# Patient Record
Sex: Female | Born: 1985
Health system: Southern US, Community
[De-identification: ages and names within clinical notes are randomized; demographics above are authoritative.]

## PROBLEM LIST (undated history)

## (undated) ENCOUNTER — Inpatient Hospital Stay (HOSPITAL_COMMUNITY): Payer: Self-pay

## (undated) DIAGNOSIS — D219 Benign neoplasm of connective and other soft tissue, unspecified: Secondary | ICD-10-CM

## (undated) DIAGNOSIS — O4400 Placenta previa specified as without hemorrhage, unspecified trimester: Secondary | ICD-10-CM

## (undated) DIAGNOSIS — N979 Female infertility, unspecified: Secondary | ICD-10-CM

## (undated) HISTORY — PX: OTHER SURGICAL HISTORY: SHX169

## (undated) HISTORY — PX: WISDOM TOOTH EXTRACTION: SHX21

## (undated) HISTORY — PX: MYOMECTOMY: SHX85

---

## 2013-05-02 ENCOUNTER — Encounter (HOSPITAL_COMMUNITY): Payer: Self-pay | Admitting: Emergency Medicine

## 2013-05-02 ENCOUNTER — Emergency Department (HOSPITAL_COMMUNITY)
Admission: EM | Admit: 2013-05-02 | Discharge: 2013-05-02 | Disposition: A | Discharge status: Home / Self Care | Attending: Emergency Medicine | Admitting: Emergency Medicine

## 2013-05-02 DIAGNOSIS — W268XXA Contact with other sharp object(s), not elsewhere classified, initial encounter: Secondary | ICD-10-CM | POA: Insufficient documentation

## 2013-05-02 DIAGNOSIS — L0390 Cellulitis, unspecified: Secondary | ICD-10-CM

## 2013-05-02 DIAGNOSIS — S81009A Unspecified open wound, unspecified knee, initial encounter: Secondary | ICD-10-CM | POA: Insufficient documentation

## 2013-05-02 DIAGNOSIS — L02419 Cutaneous abscess of limb, unspecified: Secondary | ICD-10-CM | POA: Insufficient documentation

## 2013-05-02 DIAGNOSIS — Y929 Unspecified place or not applicable: Secondary | ICD-10-CM | POA: Insufficient documentation

## 2013-05-02 DIAGNOSIS — Z23 Encounter for immunization: Secondary | ICD-10-CM | POA: Insufficient documentation

## 2013-05-02 DIAGNOSIS — Y9389 Activity, other specified: Secondary | ICD-10-CM | POA: Insufficient documentation

## 2013-05-02 MED ORDER — CEPHALEXIN 500 MG PO CAPS: 500.0000 mg | ORAL_CAPSULE | Freq: Four times a day (QID) | ORAL | Status: DC

## 2013-05-02 MED ORDER — BACITRACIN ZINC 500 UNIT/GM EX OINT: 1.0000 "application " | TOPICAL_OINTMENT | Freq: Two times a day (BID) | CUTANEOUS | Status: DC

## 2013-05-02 MED ORDER — TETANUS-DIPHTH-ACELL PERTUSSIS 5-2.5-18.5 LF-MCG/0.5 IM SUSP
0.5000 mL | Freq: Once | INTRAMUSCULAR | Status: AC
  Administered 2013-05-02: 0.5 mL via INTRAMUSCULAR
  Filled 2013-05-02: qty 0.5

## 2013-05-02 NOTE — ED Provider Notes (Signed)
CSN: [id number]     Arrival date & time 05/02/13  1528 History  This chart was scribed for non-physician practitioner Sarah Forth, PA-C, working with Sarah Jakes, MD by Sarah Berry, ED Scribe. This patient was seen in room TR05C/TR05C and the patient's care was started at 1528.    Chief Complaint  Patient presents with  . Laceration    The history is provided by the patient. No language interpreter was used.    HPI Comments: Sarah Berry is a 27 y.o. female who presents to the Emergency Department complaining of a laceration to the lateral aspect of the left ankle that occurred 4 days ago. Pt states she was shaving when the laceration occurred. She complains of constant, unchanged pain to the left posterior ankle near her achilles tendon that began today. She denies having any pain prior to this morning. She is concerned that the wound may be inffected. Her last tetanus was about 5 years ago. She denies any other symptoms including nausea, vomiting, fever, chills.  History reviewed. No pertinent past medical history. History reviewed. No pertinent past surgical history. No family history on file. History  Substance Use Topics  . Smoking status: Never Smoker   . Smokeless tobacco: Not on file  . Alcohol Use: No   OB History   Grav Para Term Preterm Abortions TAB SAB Ect Mult Living                 Review of Systems  Constitutional: Negative for fever and chills.  Gastrointestinal: Negative for nausea and vomiting.  Musculoskeletal: Positive for arthralgias (left ankle pain) and joint swelling. Negative for back pain, neck pain and neck stiffness.  Skin: Positive for wound.  Neurological: Negative for numbness.  Hematological: Does not bruise/bleed easily.  Psychiatric/Behavioral: The patient is not nervous/anxious.   All other systems reviewed and are negative.    Allergies  Bactrim  Home Medications   Current Outpatient Rx  Name  Route  Sig  Dispense   Refill  . bacitracin ointment   Topical   Apply 1 application topically 2 (two) times daily.   15 g   0   . cephALEXin (KEFLEX) 500 MG capsule   Oral   Take 1 capsule (500 mg total) by mouth 4 (four) times daily.   40 capsule   0    BP 137/94  Pulse 96  Temp(Src) 97.9 F (36.6 C) (Oral)  Resp 20  SpO2 100%  LMP 04/28/2013 Physical Exam  Nursing note and vitals reviewed. Constitutional: She is oriented to person, place, and time. She appears well-developed and well-nourished. No distress.  HENT:  Head: Normocephalic and atraumatic.  Eyes: Conjunctivae are normal. No scleral icterus.  Neck: Normal range of motion.  Cardiovascular: Normal rate, regular rhythm, normal heart sounds and intact distal pulses.   No murmur heard. Capillary refill < 3 sec  Pulmonary/Chest: Effort normal and breath sounds normal. No respiratory distress.  Musculoskeletal: Normal range of motion. She exhibits tenderness. She exhibits no edema.  ROM: Full range of motion of the left ankle with mild pain No pain to palpation of the Achilles tendon Negative Thompson's test  Neurological: She is alert and oriented to person, place, and time. Coordination normal.  Sensation: Intact and on chart throughout the entirety of the left extremity Strength: 5 out of 5 in the left lower extremity including dorsiflexion and plantar flexion  Skin: Skin is warm and dry. No rash noted. She is not diaphoretic. There is  erythema.  No tenting of the skin 6 cm laceration to the anterior left lower leg with some purulent drainage and mild erythema  Psychiatric: She has a normal mood and affect.    ED Course  Procedures   DIAGNOSTIC STUDIES: Oxygen Saturation is 100% on RA, normal by my interpretation.    COORDINATION OF CARE: 4:43 PM Discussed treatment plan with pt at bedside and pt agreed to plan.   Labs Review Labs Reviewed - No data to display Imaging Review No results found.  EKG Interpretation   None       EMERGENCY DEPARTMENT US SOFT TISSUE INTERPRETATION "Study: Limited Ultrasound of the noted body part in comments below"  INDICATIONS: Pain and Soft tissue infection Multiple views of the body part are obtained with a multi-frequency linear probe  PERFORMED BY:  Myself  IMAGES ARCHIVED?: Yes  SIDE:Left  BODY PART:Lower extremity  FINDINGS: No abcess noted  LIMITATIONS:  none  INTERPRETATION:  No abcess noted  COMMENT:  No fluid collection noted at the site a laceration or around the Achilles   MDM   1. Cellulitis     Sarah Berry presents with hx and PE consistent with cellulitis.  Pt is without risk factors for HIV; no recent use of steroids or other immunosuppressive medications; no Hx of diabetes.  Area evaluated with ultrasound including the internal his tendon and it is without gross abscess for which I&D would be possible.  Area marked and pt encouraged to return if redness begins to streak, extends beyond the markings, and/or fever or nausea/vomiting develop.  Pt is alert, oriented, NAD, afebrile, non tachycardic, nonseptic and nontoxic appearing.  Pt to be d/c on oral antibiotics with strict f/u instructions.    It has been determined that no acute conditions requiring further emergency intervention are present at this time. The patient/guardian have been advised of the diagnosis and plan. We have discussed signs and symptoms that warrant return to the ED, such as changes or worsening in symptoms.   Vital signs are stable at discharge.   BP 137/94  Pulse 96  Temp(Src) 97.9 F (36.6 C) (Oral)  Resp 20  SpO2 100%  LMP 04/28/2013  Patient/guardian has voiced understanding and agreed to follow-up with the PCP or specialist.    I personally performed the services described in this documentation, which was scribed in my presence. The recorded information has been reviewed and is accurate.    Sarah Berry Sarah Lean, PA-C 05/02/13 1711

## 2013-05-02 NOTE — ED Notes (Signed)
Pt st's she cut her right lower leg while shaving 4 days ago.   Pt st's now area is red and painful.

## 2013-05-07 NOTE — ED Provider Notes (Signed)
Medical screening examination/treatment/procedure(s) were performed by non-physician practitioner and as supervising physician I was immediately available for consultation/collaboration.  EKG Interpretation   None         Lindora Alviar W. Ariza Evans, MD 05/07/13 0834 

## 2015-12-02 LAB — OB RESULTS CONSOLE GC/CHLAMYDIA
Chlamydia: NEGATIVE
Gonorrhea: NEGATIVE

## 2015-12-02 LAB — OB RESULTS CONSOLE HIV ANTIBODY (ROUTINE TESTING): HIV: NONREACTIVE

## 2015-12-02 LAB — OB RESULTS CONSOLE RUBELLA ANTIBODY, IGM: Rubella: IMMUNE

## 2015-12-02 LAB — OB RESULTS CONSOLE ABO/RH: RH TYPE: POSITIVE

## 2015-12-02 LAB — OB RESULTS CONSOLE HEPATITIS B SURFACE ANTIGEN: Hepatitis B Surface Ag: NEGATIVE

## 2015-12-02 LAB — OB RESULTS CONSOLE RPR: RPR: NONREACTIVE

## 2015-12-02 LAB — OB RESULTS CONSOLE ANTIBODY SCREEN: Antibody Screen: NEGATIVE

## 2015-12-06 ENCOUNTER — Encounter (HOSPITAL_COMMUNITY): Payer: Self-pay | Admitting: *Deleted

## 2015-12-06 ENCOUNTER — Inpatient Hospital Stay (HOSPITAL_COMMUNITY)
Admission: AD | Admit: 2015-12-06 | Discharge: 2015-12-06 | Disposition: A | Discharge status: Home / Self Care | Payer: BLUE CROSS/BLUE SHIELD | Source: Ambulatory Visit | Attending: Obstetrics & Gynecology | Admitting: Obstetrics & Gynecology

## 2015-12-06 ENCOUNTER — Emergency Department (HOSPITAL_COMMUNITY)
Admission: EM | Admit: 2015-12-06 | Discharge: 2015-12-06 | Discharge status: Against Medical Advice | Payer: BLUE CROSS/BLUE SHIELD | Source: Home / Self Care

## 2015-12-06 DIAGNOSIS — Z5321 Procedure and treatment not carried out due to patient leaving prior to being seen by health care provider: Secondary | ICD-10-CM | POA: Insufficient documentation

## 2015-12-06 DIAGNOSIS — O209 Hemorrhage in early pregnancy, unspecified: Secondary | ICD-10-CM | POA: Insufficient documentation

## 2015-12-06 DIAGNOSIS — Z3A12 12 weeks gestation of pregnancy: Secondary | ICD-10-CM | POA: Diagnosis not present

## 2015-12-06 DIAGNOSIS — Z3A13 13 weeks gestation of pregnancy: Secondary | ICD-10-CM | POA: Insufficient documentation

## 2015-12-06 HISTORY — DX: Female infertility, unspecified: N97.9

## 2015-12-06 HISTORY — DX: Benign neoplasm of connective and other soft tissue, unspecified: D21.9

## 2015-12-06 NOTE — MAU Note (Signed)
Pt brought into MAU by Dr. Alwyn Pea with vaginal bleeding. Approx [redacted] week pregnant.

## 2015-12-06 NOTE — ED Notes (Signed)
I have just been informed by our registrationist, Pam that  A female doctor came into our lobby, met pt. And they collectively informed our registrationist that they are going immediately to [place] then asked Pam to have me "take the patient out of the computer".

## 2015-12-06 NOTE — ED Triage Notes (Addendum)
Pt is [redacted] weeks pregnant. Pt complains of vaginal bleeding since 420PM today, lower abdominal cramping since this morning. Pt states she had ultrasound showing subchorionic hematoma 2 days ago. Pt states she had subchorionic hematoma 5-6 weeks ago and had bleeding as well, but did not have cramping as she does today. Pt is unsure how much blood was lost, pt was not wearing pad at the time.  Pt is currently taking antibiotics for bacterial vaginosis since Tuesday.

## 2015-12-06 NOTE — Discharge Instructions (Signed)
Keep your scheduled appointment for prenatal care. Call the office or provider on call with further concerns and/or return to MAU as needed. Vaginal Bleeding During Pregnancy, Second Trimester A small amount of bleeding (spotting) from the vagina is relatively common in pregnancy. It usually stops on its own. Various things can cause bleeding or spotting in pregnancy. Some bleeding may be related to the pregnancy, and some may not. Sometimes the bleeding is normal and is not a problem. However, bleeding can also be a sign of something serious. Be sure to tell your health care provider about any vaginal bleeding right away. Some possible causes of vaginal bleeding during the second trimester include:  Infection, inflammation, or growths on the cervix.   The placenta may be partially or completely covering the opening of the cervix inside the uterus (placenta previa).  The placenta may have separated from the uterus (abruption of the placenta).   You may be having early (preterm) labor.   The cervix may not be strong enough to keep a baby inside the uterus (cervical insufficiency).   Tiny cysts may have developed in the uterus instead of pregnancy tissue (molar pregnancy). HOME CARE INSTRUCTIONS  Watch your condition for any changes. The following actions may help to lessen any discomfort you are feeling:  Follow your health care provider's instructions for limiting your activity. If your health care provider orders bed rest, you may need to stay in bed and only get up to use the bathroom. However, your health care provider may allow you to continue light activity.  If needed, make plans for someone to help with your regular activities and responsibilities while you are on bed rest.  Keep track of the number of pads you use each day, how often you change pads, and how soaked (saturated) they are. Write this down.  Do not use tampons. Do not douche.  Do not have sexual intercourse or  orgasms until approved by your health care provider.  If you pass any tissue from your vagina, save the tissue so you can show it to your health care provider.  Only take over-the-counter or prescription medicines as directed by your health care provider.  Do not take aspirin because it can make you bleed.  Do not exercise or perform any strenuous activities or heavy lifting without your health care provider's permission.  Keep all follow-up appointments as directed by your health care provider. SEEK MEDICAL CARE IF:  You have any vaginal bleeding during any part of your pregnancy.  You have cramps or labor pains.  You have a fever, not controlled by medicine. SEEK IMMEDIATE MEDICAL CARE IF:   You have severe cramps in your back or belly (abdomen).  You have contractions.  You have chills.  You pass large clots or tissue from your vagina.  Your bleeding increases.  You feel light-headed or weak, or you have fainting episodes.  You are leaking fluid or have a gush of fluid from your vagina. MAKE SURE YOU:  Understand these instructions.  Will watch your condition.  Will get help right away if you are not doing well or get worse.   This information is not intended to replace advice given to you by your health care provider. Make sure you discuss any questions you have with your health care provider.   Document Released: 02/03/2005 Document Revised: 05/01/2013 Document Reviewed: 01/01/2013 Elsevier Interactive Patient Education [employer].

## 2015-12-06 NOTE — MAU Provider Note (Signed)
  History   30 yo G1P0 at 12 weeks 4 days presents with acute hemorrhage at work / home, she notes  It soaked through her underwear and onto the floor. She also passed clots.  She denies chest pain,  Shortness of breath, no light headedness or dizziness.  She is having abdominal/pelvic cramping.    CSN: [id number]  Arrival date and time: 12/06/15 1757   None     Chief Complaint  Patient presents with  . Vaginal Bleeding   HPI  Pertinent Gynecological History: Menses: regular every month without intermenstrual spotting Bleeding: acute bleed in pregnancy Contraception: none DES exposure: denies Blood transfusions: none Sexually transmitted diseases: no past history Previous GYN Procedures: none  Last mammogram: n/a  Last pap: normal Date: 11/2015   Past Medical History:  Diagnosis Date  . Fibroid   . Infertility, female, primary   . Newborn product of in vitro fertilization (IVF) pregnancy     Past Surgical History:  Procedure Laterality Date  . egg retrieval    . MYOMECTOMY      History reviewed. No pertinent family history.  Social History  Substance Use Topics  . Smoking status: Never Smoker  . Smokeless tobacco: Never Used  . Alcohol use No    Allergies:  Allergies  Allergen Reactions  . Bactrim [Sulfamethoxazole-Trimethoprim] Other (See Comments)    "childhood allergy"    Prescriptions Prior to Admission  Medication Sig Dispense Refill Last Dose  . bacitracin ointment Apply 1 application topically 2 (two) times daily. 15 g 0   . cephALEXin (KEFLEX) 500 MG capsule Take 1 capsule (500 mg total) by mouth 4 (four) times daily. 40 capsule 0     ROS Physical Exam   Blood pressure 129/80, pulse 92, temperature 97.9 F (36.6 C), temperature source Oral, resp. rate 18.  Physical Exam External vulva: normal Vaginal vault: old blood Cervical os: closed, long    MAU Course  Procedures Bedside TV sonogram:  Viable SIUP, possible subchorionic hematoma  @2  cm., Possible  Placenta previa.  Cervix: long    Assessment and Plan  30 yo G1P0 IVF pregnancy at 12 weeks 4 days with acute bleed, possible [facility] vs previa Patient given AB precautions Will f/u in office next week for visit with me and official ultrasound.  Pelvic rest Note provided for off days from work  Sanjuana Kava [facility] 12/06/2015, 6:23 PM

## 2015-12-31 ENCOUNTER — Other Ambulatory Visit: Payer: Self-pay | Admitting: Obstetrics & Gynecology

## 2016-02-03 ENCOUNTER — Inpatient Hospital Stay (HOSPITAL_COMMUNITY): Payer: BLUE CROSS/BLUE SHIELD

## 2016-02-03 ENCOUNTER — Encounter (HOSPITAL_COMMUNITY): Payer: Self-pay

## 2016-02-03 ENCOUNTER — Inpatient Hospital Stay (HOSPITAL_COMMUNITY)
Admission: AD | Admit: 2016-02-03 | Discharge: 2016-02-03 | Disposition: A | Discharge status: Home / Self Care | Payer: BLUE CROSS/BLUE SHIELD | Source: Ambulatory Visit | Attending: Obstetrics and Gynecology | Admitting: Obstetrics and Gynecology

## 2016-02-03 DIAGNOSIS — Z3A21 21 weeks gestation of pregnancy: Secondary | ICD-10-CM | POA: Insufficient documentation

## 2016-02-03 DIAGNOSIS — O4692 Antepartum hemorrhage, unspecified, second trimester: Secondary | ICD-10-CM | POA: Diagnosis not present

## 2016-02-03 DIAGNOSIS — O4402 Placenta previa specified as without hemorrhage, second trimester: Secondary | ICD-10-CM | POA: Diagnosis not present

## 2016-02-03 LAB — CBC
HEMATOCRIT: 30.3 % — AB (ref 36.0–46.0)
HEMOGLOBIN: 10.9 g/dL — AB (ref 12.0–15.0)
MCH: 30.6 pg (ref 26.0–34.0)
MCHC: 36 g/dL (ref 30.0–36.0)
MCV: 85.1 fL (ref 78.0–100.0)
Platelets: 230 10*3/uL (ref 150–400)
RBC: 3.56 MIL/uL — ABNORMAL LOW (ref 3.87–5.11)
RDW: 12.4 % (ref 11.5–15.5)
WBC: 7.9 10*3/uL (ref 4.0–10.5)

## 2016-02-03 LAB — ABO/RH: ABO/RH(D): O POS

## 2016-02-03 NOTE — MAU Provider Note (Signed)
History     CSN: [id number]  Arrival date and time: 02/03/16 [id number]   First Provider Initiated Contact with Patient 02/03/16 [phone]      Chief Complaint  Patient presents with  . Vaginal Bleeding   HPI Sarah Berry is a 30 y.o. G1P0 at [redacted]w[redacted]d who presents with vaginal bleeding. OBhx significant for complete placenta previa & [facility]. Pt states this is her first bleed since finding out about the previa; last episode of vaginal bleeding was at 12 weeks. Woke up this morning with episode of bright red blood that soaked through her clothes & blankets; left a area on bed of blood ~10 cm diameter. Pt went to the shower to clean up & passed a golf ball sized clot. States the bleeding has decreased since then. Denies abdominal pain. Pt on pelvic rest d/t the previa & denies intercourse.   OB History    Gravida Para Term Preterm AB Living   1         0   SAB TAB Ectopic Multiple Live Births                  Past Medical History:  Diagnosis Date  . Fibroid   . Infertility, female, primary   . Newborn product of in vitro fertilization (IVF) pregnancy     Past Surgical History:  Procedure Laterality Date  . egg retrieval    . MYOMECTOMY      Family History  Problem Relation Age of Onset  . Cancer Paternal Grandmother     Social History  Substance Use Topics  . Smoking status: Never Smoker  . Smokeless tobacco: Never Used  . Alcohol use No    Allergies:  Allergies  Allergen Reactions  . Bactrim [Sulfamethoxazole-Trimethoprim] Other (See Comments)    "childhood allergy"    Prescriptions Prior to Admission  Medication Sig Dispense Refill Last Dose  . diphenhydrAMINE (BENADRYL) 25 MG tablet Take 25 mg by mouth every 6 (six) hours as needed.   02/02/2016 at Unknown time  . Prenat-FeFmCb-DSS-FA-DHA w/o A (CITRANATAL HARMONY) 27-1-260 MG CAPS Take 1 capsule by mouth daily.   11 02/02/2016 at Unknown time    Review of Systems  Constitutional: Negative.   Gastrointestinal: Negative.    Genitourinary:       + vaginal bleeding   Physical Exam   Blood pressure 126/83, pulse 96, temperature 98.2 F (36.8 C), temperature source Oral, resp. rate 18, height 5' 7.5" (1.715 m), weight 171 lb (77.6 kg), SpO2 96 %.  Physical Exam  Nursing note and vitals reviewed. Constitutional: She is oriented to person, place, and time. She appears well-developed and well-nourished. No distress.  HENT:  Head: Normocephalic and atraumatic.  Eyes: Conjunctivae are normal. Right eye exhibits no discharge. Left eye exhibits no discharge. No scleral icterus.  Neck: Normal range of motion.  Respiratory: Effort normal. No respiratory distress.  Genitourinary:  Genitourinary Comments: No blood on pad since arriving to MAU. Small amount of red staining of vulva & upper inner thigh  Neurological: She is alert and oriented to person, place, and time.  Skin: Skin is warm and dry. She is not diaphoretic.  Psychiatric: She has a normal mood and affect. Her behavior is normal. Judgment and thought content normal.    MAU Course  Procedures Results for orders placed or performed during the hospital encounter of 02/03/16 (from the past 24 hour(s))  CBC     Status: Abnormal   Collection Time: 02/03/16  9:55 AM  Result Value Ref Range   WBC 7.9 4.0 - 10.5 K/uL   RBC 3.56 (L) 3.87 - 5.11 MIL/uL   Hemoglobin 10.9 (L) 12.0 - 15.0 g/dL   HCT 30.3 (L) 36.0 - 46.0 %   MCV 85.1 78.0 - 100.0 fL   MCH 30.6 26.0 - 34.0 pg   MCHC 36.0 30.0 - 36.0 g/dL   RDW 12.4 11.5 - 15.5 %   Platelets 230 150 - 400 K/uL  ABO/Rh     Status: None   Collection Time: 02/03/16  9:55 AM  Result Value Ref Range   ABO/RH(D) O POS     MDM CBC, abo/rh, ultrasound FHT 145 by doppler Ultrasound shows complete placenta previa & 2 myomas. No [place] or abruption S/w Dr. Harrington Challenger. Can discharge home on modified bedrest x 72 hrs to return if symptoms worsen. F/u in office by Thursday or Friday Discussed results & plan with patient; pt  agreeable to plan Assessment and Plan  A: 1. [redacted] weeks gestation of pregnancy   2. Vaginal bleeding in pregnancy, second trimester   3. Placenta previa antepartum in second trimester     P: Discharge home Pelvic rest & modified bedrest -- work note provided Discussed reasons to return to MAU Call office to schedule f/u appointment by the end of the week  Jorje Guild 02/03/2016, 9:39 AM

## 2016-02-03 NOTE — MAU Note (Signed)
Pt c/o a large amount of bleeding when she woke up this morning. Pt states she has previa and has previously had bleeding with this pregnancy. Pt states she also passed a clot. Pt states baby is moving normally. Pt denies leaking of fluid.

## 2016-02-03 NOTE — Discharge Instructions (Signed)
Pelvic Rest Pelvic rest is sometimes recommended for women when:   The placenta is partially or completely covering the opening of the cervix (placenta previa).  There is bleeding between the uterine wall and the amniotic sac in the first trimester (subchorionic hemorrhage).  The cervix begins to open without labor starting (incompetent cervix, cervical insufficiency).  The labor is too early (preterm labor). HOME CARE INSTRUCTIONS  Do not have sexual intercourse, stimulation, or an orgasm.  Do not use tampons, douche, or put anything in the vagina.  Do not lift anything over 10 pounds (4.5 kg).  Avoid strenuous activity or straining your pelvic muscles. SEEK MEDICAL CARE IF:  You have any vaginal bleeding during pregnancy. Treat this as a potential emergency.  You have cramping pain felt low in the stomach (stronger than menstrual cramps).  You notice vaginal discharge (watery, mucus, or bloody).  You have a low, dull backache.  There are regular contractions or uterine tightening. SEEK IMMEDIATE MEDICAL CARE IF: You have vaginal bleeding and have placenta previa.    This information is not intended to replace advice given to you by your health care provider. Make sure you discuss any questions you have with your health care provider.   Document Released: 08/21/2010 Document Revised: 07/19/2011 Document Reviewed: 10/28/2014 Elsevier Interactive Patient Education 2016 [employer]. Placenta Previa Placenta previa is a condition in pregnant women where the placenta implants in the lower part of the uterus. The placenta either partially or completely covers the opening to the cervix. This is a problem because the baby must pass through the cervix during delivery. There are three types of placenta previa. They include:  1. Marginal placenta previa. The placenta is near the cervix, but does not cover the opening. 2. Partial placenta previa. The placenta covers part of the  cervical opening. 3. Complete placenta previa. The placenta covers the entire cervical opening.  Depending on the type of placenta previa, there is a chance the placenta may move into a normal position and no longer cover the cervix as the pregnancy progresses. It is important to keep all prenatal visits with your caregiver.  RISK FACTORS You may be more likely to develop placenta previa if you:   Are carrying more than one baby (multiples).   Have an abnormally shaped uterus.   Have scars on the lining of the uterus.   Had previous surgeries involving the uterus, such as a cesarean delivery.   Have delivered a baby previously.   Have a history of placenta previa.   Have smoked or used cocaine during pregnancy.   Are age 32 or older during pregnancy.  SYMPTOMS The main symptom of placenta previa is sudden, painless vaginal bleeding during the second half of pregnancy. The amount of bleeding can be light to very heavy. The bleeding may stop on its own, but almost always returns. Cramping, regular contractions, abdominal pain, and lower back pain can also occur with placenta previa.  DIAGNOSIS Placenta previa can be diagnosed through an ultrasound by finding where the placenta is located. The ultrasound may find placenta previa either during a routine prenatal visit or after vaginal bleeding is noticed. If you are diagnosed with placenta previa, your caregiver may avoid vaginal exams to reduce the risk of heavy bleeding. There is a chance that placenta previa may not be diagnosed until bleeding occurs during labor.  TREATMENT Specific treatment depends on:   How much you are bleeding or if the bleeding has stopped.  How far  along you are in your pregnancy.   The condition of the baby.   The location of the baby and placenta.   The type of placenta previa.  Depending on the factors above, your caregiver may recommend:   Decreased activity.   Bed rest at home or in  the hospital.  Pelvic rest. This means no sex, using tampons, douching, pelvic exams, or placing anything into the vagina.  A blood transfusion to replace maternal blood loss.  A cesarean delivery if the bleeding is heavy and cannot be controlled or the placenta completely covers the cervix.  Medication to stop premature labor or mature the fetal lungs if delivery is needed before the pregnancy is full term.  WHEN SHOULD YOU SEEK IMMEDIATE MEDICAL CARE IF YOU ARE SENT HOME WITH PLACENTA PREVIA? Seek immediate medical care if you show any symptoms of placenta previa. You will need to go to the hospital to get checked immediately. Again, those symptoms are:  Sudden, painless vaginal bleeding, even a small amount.  Cramping or regular contractions.  Lower back or abdominal pain.   This information is not intended to replace advice given to you by your health care provider. Make sure you discuss any questions you have with your health care provider.   Document Released: 04/26/2005 Document Revised: 05/17/2014 Document Reviewed: 07/28/2012 Elsevier Interactive Patient Education [employer].

## 2016-02-06 ENCOUNTER — Encounter (HOSPITAL_COMMUNITY): Payer: Self-pay | Admitting: Obstetrics

## 2016-02-06 ENCOUNTER — Other Ambulatory Visit (HOSPITAL_COMMUNITY): Payer: Self-pay | Admitting: Obstetrics

## 2016-02-06 DIAGNOSIS — Z3A22 22 weeks gestation of pregnancy: Secondary | ICD-10-CM

## 2016-02-06 DIAGNOSIS — Z3689 Encounter for other specified antenatal screening: Secondary | ICD-10-CM

## 2016-02-13 ENCOUNTER — Encounter (HOSPITAL_COMMUNITY): Payer: Self-pay

## 2016-02-13 ENCOUNTER — Other Ambulatory Visit (HOSPITAL_COMMUNITY): Payer: Self-pay | Admitting: Obstetrics

## 2016-02-13 ENCOUNTER — Ambulatory Visit (HOSPITAL_COMMUNITY)
Admission: RE | Admit: 2016-02-13 | Discharge: 2016-02-13 | Disposition: A | Discharge status: Home / Self Care | Payer: BLUE CROSS/BLUE SHIELD | Source: Ambulatory Visit | Attending: Obstetrics | Admitting: Obstetrics

## 2016-02-13 DIAGNOSIS — Z363 Encounter for antenatal screening for malformations: Secondary | ICD-10-CM | POA: Insufficient documentation

## 2016-02-13 DIAGNOSIS — Z3A22 22 weeks gestation of pregnancy: Secondary | ICD-10-CM

## 2016-02-13 DIAGNOSIS — Z3689 Encounter for other specified antenatal screening: Secondary | ICD-10-CM

## 2016-02-13 DIAGNOSIS — O09812 Supervision of pregnancy resulting from assisted reproductive technology, second trimester: Secondary | ICD-10-CM

## 2016-02-13 DIAGNOSIS — D259 Leiomyoma of uterus, unspecified: Secondary | ICD-10-CM | POA: Diagnosis not present

## 2016-02-13 DIAGNOSIS — O4402 Placenta previa specified as without hemorrhage, second trimester: Secondary | ICD-10-CM

## 2016-02-13 DIAGNOSIS — O3412 Maternal care for benign tumor of corpus uteri, second trimester: Secondary | ICD-10-CM | POA: Diagnosis not present

## 2016-02-13 NOTE — Consult Note (Signed)
Maternal Fetal Medicine Consultation  Requesting Provider(s): Jerelyn Charles, MD  Reason for consultation: Placenta previa, possible vasa previa s/p admission for vaginal bleeding  HPI: Sarah Berry is a 30 yo G1P0, EDD 06/15/2016 who is currently at 22w 3d seen for consultation due to placenta previa / possible vasa previa who has now been seen in MAU on several occasions due to vaginal bleeding.  Most recent episode of vaginal bleeding occurred on 9/26 - the patient reports that she woke up in bed and had an episode of bright red bleeding that soaked through her closes and blankets - passed a golf ball sized clot.  She had some brownish vaginal discharge subsequently, but no active vaginal bleeding.  Earlier in the pregnancy she had subchorionic hematomas that were not appreciated on recent ultrasound.  Her current pregnancy was via IVF.  She underwent a myomectomy prior to IVF - had several fibroids that were removed, but is felt to be a candidate for vaginal delivery otherwise. She is otherwise without complaints.  OB History: OB History    Gravida Para Term Preterm AB Living   1         0   SAB TAB Ectopic Multiple Live Births                  PMH:  Past Medical History:  Diagnosis Date  . Fibroid   . Infertility, female, primary   . Newborn product of in vitro fertilization (IVF) pregnancy     PSH:  Past Surgical History:  Procedure Laterality Date  . egg retrieval    . MYOMECTOMY    . WISDOM TOOTH EXTRACTION     Meds:  Current Outpatient Prescriptions on File Prior to Encounter  Medication Sig Dispense Refill  . cetirizine (ZYRTEC) 10 MG tablet Take 10 mg by mouth daily.    . diphenhydrAMINE (BENADRYL) 25 MG tablet Take 25 mg by mouth every 6 (six) hours as needed.    . Prenat-FeFmCb-DSS-FA-DHA w/o A (CITRANATAL HARMONY) 27-1-260 MG CAPS Take 1 capsule by mouth daily.   11   No current facility-administered medications on file prior to encounter.    Allergies:  Allergies   Allergen Reactions  . Bactrim [Sulfamethoxazole-Trimethoprim] Other (See Comments)    "childhood allergy"   FH:  Family History  Problem Relation Age of Onset  . Cancer Paternal Grandmother   Denies family history of birth defects or hereditary disorders  Soc:  Social History   Social History  . Marital status: Married    Spouse name: N/A  . Number of children: N/A  . Years of education: N/A   Occupational History  . Not on file.   Social History Main Topics  . Smoking status: Never Smoker  . Smokeless tobacco: Never Used  . Alcohol use No  . Drug use: No  . Sexual activity: Not Currently    Birth control/ protection: None   Other Topics Concern  . Not on file   Social History Narrative  . No narrative on file   PE:  173 lbs, 116/76, 102  GEN: well-appearing female ABD: gravid, NT  Ultrasound:  Single IUP at 22w 3d Bilateral urinary tract dilation is noted.  Both the renal pelvises measure approximately 75mm.  No calyceal dilation noted (UTD A1) Limited views of the fetal heart were obtained (RVOT) The estimated fetal weight is at the 59th %tile. Several uterine myomas noted as described above. Normal amniotic fluid volume  TVUS: A complete / central placenta  previa is noted.  No obvious subchorionic fluid collections were noted.  There appears to be a marginal placental cord insertion, but no vessels appear to course along fetal membranes (no unprotected fetal vessels)  Labs: CBC    Component Value Date/Time   WBC 7.9 02/03/2016 0955   RBC 3.56 (L) 02/03/2016 0955   HGB 10.9 (L) 02/03/2016 0955   HCT 30.3 (L) 02/03/2016 0955   PLT 230 02/03/2016 0955   MCV 85.1 02/03/2016 0955   MCH 30.6 02/03/2016 0955   MCHC 36.0 02/03/2016 0955   RDW 12.4 02/03/2016 0955     A/P: 1) Single IUP at 22w 3d  2) Bilateral / mild urinary tract dilation - will need follow up in the 3rd trimester and if persistent will need evaluation of the newborn after delivery.   Briefly discussed renal pyelectasis (Urinary tract dilation) as a soft marker for Down syndrome.  The patient reports having NIPT that was low risk for aneuploidy that makes this risk essentially negligible.  3) Complete placenta previa - do not see any evidence of vasa previa; no unprotected fetal vessels seen.Vaginal bleeding prior to the 3rd trimester is unusual with placenta previa - and could potentially have been due to subchorionic hemorrhage. Nevertheless, if the patient has any additional active bleeding, would have a low threshold for admission and a course of betamethasone (if after [redacted] weeks gestation).    Recommendations: 1) Would consider adding iron supplementation given mild anemia and vaginal bleeding 2) Admission and betamethasone (after 23 weeks) if any additional bleeding.  Would consider long term hospitalization after second documented episode of vaginal bleeding after fetal viability is reached 3) Follow up ultrasound in 6 weeks to reevaluate the kidneys / placenta and to complete anatomy 4) Will likely require cesarean delivery - if stable, would recommend delivery at approximately 36 weeks after a course of betamethasone prior to delivery.  Obviously, would deliver earlier based on the clinical scenario.   Thank you for the opportunity to be a part of the care of Sarah Berry. Please contact our office if we can be of further assistance.   I spent approximately 30 minutes with this patient with over 50% of time spent in face-to-face counseling.  Benjaman Lobe, MD Maternal Fetal Medicine

## 2016-02-16 ENCOUNTER — Other Ambulatory Visit (HOSPITAL_COMMUNITY): Payer: Self-pay | Admitting: *Deleted

## 2016-02-16 DIAGNOSIS — O4400 Placenta previa specified as without hemorrhage, unspecified trimester: Secondary | ICD-10-CM

## 2016-02-22 ENCOUNTER — Inpatient Hospital Stay (HOSPITAL_COMMUNITY)
Admission: AD | Admit: 2016-02-22 | Discharge: 2016-02-29 | DRG: 782 | Disposition: A | Discharge status: Home / Self Care | Payer: BLUE CROSS/BLUE SHIELD | Source: Ambulatory Visit | Attending: Obstetrics and Gynecology | Admitting: Obstetrics and Gynecology

## 2016-02-22 ENCOUNTER — Inpatient Hospital Stay (HOSPITAL_COMMUNITY): Payer: BLUE CROSS/BLUE SHIELD

## 2016-02-22 ENCOUNTER — Encounter (HOSPITAL_COMMUNITY): Payer: Self-pay | Admitting: *Deleted

## 2016-02-22 DIAGNOSIS — O4412 Placenta previa with hemorrhage, second trimester: Principal | ICD-10-CM | POA: Diagnosis present

## 2016-02-22 DIAGNOSIS — D259 Leiomyoma of uterus, unspecified: Secondary | ICD-10-CM | POA: Diagnosis present

## 2016-02-22 DIAGNOSIS — O3412 Maternal care for benign tumor of corpus uteri, second trimester: Secondary | ICD-10-CM | POA: Diagnosis present

## 2016-02-22 DIAGNOSIS — Z3A23 23 weeks gestation of pregnancy: Secondary | ICD-10-CM

## 2016-02-22 DIAGNOSIS — O09812 Supervision of pregnancy resulting from assisted reproductive technology, second trimester: Secondary | ICD-10-CM | POA: Diagnosis not present

## 2016-02-22 DIAGNOSIS — O4692 Antepartum hemorrhage, unspecified, second trimester: Secondary | ICD-10-CM | POA: Diagnosis not present

## 2016-02-22 DIAGNOSIS — O4402 Placenta previa specified as without hemorrhage, second trimester: Secondary | ICD-10-CM | POA: Diagnosis present

## 2016-02-22 LAB — CBC
HCT: 29.9 % — ABNORMAL LOW (ref 36.0–46.0)
Hemoglobin: 10.2 g/dL — ABNORMAL LOW (ref 12.0–15.0)
MCH: 29.7 pg (ref 26.0–34.0)
MCHC: 34.1 g/dL (ref 30.0–36.0)
MCV: 86.9 fL (ref 78.0–100.0)
PLATELETS: 245 10*3/uL (ref 150–400)
RBC: 3.44 MIL/uL — AB (ref 3.87–5.11)
RDW: 12.7 % (ref 11.5–15.5)
WBC: 9.9 10*3/uL (ref 4.0–10.5)

## 2016-02-22 LAB — URINE MICROSCOPIC-ADD ON

## 2016-02-22 LAB — URINALYSIS, ROUTINE W REFLEX MICROSCOPIC
Bilirubin Urine: NEGATIVE
GLUCOSE, UA: NEGATIVE mg/dL
Ketones, ur: NEGATIVE mg/dL
Leukocytes, UA: NEGATIVE
Nitrite: NEGATIVE
PH: 6.5 (ref 5.0–8.0)
Protein, ur: NEGATIVE mg/dL
Specific Gravity, Urine: 1.02 (ref 1.005–1.030)

## 2016-02-22 LAB — TYPE AND SCREEN
ABO/RH(D): O POS
ANTIBODY SCREEN: NEGATIVE

## 2016-02-22 MED ORDER — BETAMETHASONE SOD PHOS & ACET 6 (3-3) MG/ML IJ SUSP
12.0000 mg | Freq: Once | INTRAMUSCULAR | Status: AC
  Administered 2016-02-23: 12 mg via INTRAMUSCULAR
  Filled 2016-02-22: qty 2

## 2016-02-22 MED ORDER — BETAMETHASONE SOD PHOS & ACET 6 (3-3) MG/ML IJ SUSP
12.0000 mg | Freq: Once | INTRAMUSCULAR | Status: AC
  Administered 2016-02-22: 12 mg via INTRAMUSCULAR
  Filled 2016-02-22: qty 2

## 2016-02-22 MED ORDER — PRENATAL MULTIVITAMIN CH
1.0000 | ORAL_TABLET | Freq: Every day | ORAL | Status: DC
  Filled 2016-02-22: qty 1

## 2016-02-22 MED ORDER — INFLUENZA VAC SPLIT QUAD 0.5 ML IM SUSY
0.5000 mL | PREFILLED_SYRINGE | INTRAMUSCULAR | Status: AC
  Administered 2016-02-23: 0.5 mL via INTRAMUSCULAR
  Filled 2016-02-22: qty 0.5

## 2016-02-22 MED ORDER — ZOLPIDEM TARTRATE 5 MG PO TABS
5.0000 mg | ORAL_TABLET | Freq: Every evening | ORAL | Status: DC | PRN
  Administered 2016-02-23 – 2016-02-28 (×5): 5 mg via ORAL
  Filled 2016-02-22 (×5): qty 1

## 2016-02-22 MED ORDER — DOCUSATE SODIUM 100 MG PO CAPS
100.0000 mg | ORAL_CAPSULE | Freq: Every day | ORAL | Status: DC
  Administered 2016-02-22 – 2016-02-28 (×7): 100 mg via ORAL
  Filled 2016-02-22 (×9): qty 1

## 2016-02-22 MED ORDER — CALCIUM CARBONATE ANTACID 500 MG PO CHEW
2.0000 | CHEWABLE_TABLET | ORAL | Status: DC | PRN
  Filled 2016-02-22: qty 2

## 2016-02-22 MED ORDER — ACETAMINOPHEN 325 MG PO TABS: 650.0000 mg | ORAL_TABLET | ORAL | Status: DC | PRN

## 2016-02-22 MED ORDER — PRENATAL MULTIVITAMIN CH
1.0000 | ORAL_TABLET | Freq: Every day | ORAL | Status: DC
  Administered 2016-02-22 – 2016-02-28 (×7): 1 via ORAL
  Filled 2016-02-22 (×7): qty 1

## 2016-02-22 NOTE — MAU Provider Note (Signed)
History     CSN: [id number]  Arrival date and time: 02/22/16 1706   First Provider Initiated Contact with Patient 02/22/16 1742      Chief Complaint  Patient presents with  . Vaginal Bleeding   HPI   Ms. Sarah Berry is a 30 y.o. female  G1P0 at [redacted]w[redacted]d who presents to MAU with vaginal bleeding.  Past medical history is significant for complete placenta previa and [facility] in the first trimester . Pt states this is her 6th bleed since she became pregnant via IVF.  She was recently seen at 21 weeks with similar complaints. This morning around 1030 she passed a blood clot the size of the palm of her hand. She was using the restroom at the time of passing the first clot. On arrival here she used the bathroom and again passed another clot that was "much small". She denies abdominal pain at this time. She felt some pulling in her vagina earlier today, however no pain.    OB History    Gravida Para Term Preterm AB Living   1         0   SAB TAB Ectopic Multiple Live Births                  Past Medical History:  Diagnosis Date  . Fibroid   . Infertility, female, primary   . Newborn product of in vitro fertilization (IVF) pregnancy     Past Surgical History:  Procedure Laterality Date  . egg retrieval    . MYOMECTOMY    . WISDOM TOOTH EXTRACTION      Family History  Problem Relation Age of Onset  . Cancer Paternal Grandmother     Social History  Substance Use Topics  . Smoking status: Never Smoker  . Smokeless tobacco: Never Used  . Alcohol use No    Allergies:  Allergies  Allergen Reactions  . Bactrim [Sulfamethoxazole-Trimethoprim] Other (See Comments)    "childhood allergy"    Prescriptions Prior to Admission  Medication Sig Dispense Refill Last Dose  . cetirizine (ZYRTEC) 10 MG tablet Take 10 mg by mouth daily.   Taking  . diphenhydrAMINE (BENADRYL) 25 MG tablet Take 25 mg by mouth every 6 (six) hours as needed.   Not Taking  . Prenat-FeFmCb-DSS-FA-DHA w/o A  (CITRANATAL HARMONY) 27-1-260 MG CAPS Take 1 capsule by mouth daily.   68 Taking   Results for orders placed or performed during the hospital encounter of 02/22/16 (from the past 48 hour(s))  Urinalysis, Routine w reflex microscopic (not at [facility])     Status: Abnormal   Collection Time: 02/22/16  5:22 PM  Result Value Ref Range   Color, Urine YELLOW YELLOW   APPearance HAZY (A) CLEAR   Specific Gravity, Urine 1.020 1.005 - 1.030   pH 6.5 5.0 - 8.0   Glucose, UA NEGATIVE NEGATIVE mg/dL   Hgb urine dipstick LARGE (A) NEGATIVE   Bilirubin Urine NEGATIVE NEGATIVE   Ketones, ur NEGATIVE NEGATIVE mg/dL   Protein, ur NEGATIVE NEGATIVE mg/dL   Nitrite NEGATIVE NEGATIVE   Leukocytes, UA NEGATIVE NEGATIVE  Urine microscopic-add on     Status: Abnormal   Collection Time: 02/22/16  5:22 PM  Result Value Ref Range   Squamous Epithelial / LPF 0-5 (A) NONE SEEN   WBC, UA 0-5 0 - 5 WBC/hpf   RBC / HPF TOO NUMEROUS TO COUNT 0 - 5 RBC/hpf   Bacteria, UA FEW (A) NONE SEEN    Review  of Systems  Constitutional: Negative for chills and fever.  Gastrointestinal: Negative for abdominal pain.   Physical Exam   Blood pressure 127/79, pulse 87, temperature 97.7 F (36.5 C), temperature source Oral, resp. rate 18, last menstrual period 09/07/2015, SpO2 97 %.  Physical Exam  Constitutional: She is oriented to person, place, and time. She appears well-developed and well-nourished. No distress.  HENT:  Head: Normocephalic.  Eyes: Pupils are equal, round, and reactive to light.  GI: Soft. She exhibits no distension and no mass. There is no tenderness. There is no rebound and no guarding.  Genitourinary:  Genitourinary Comments: Vagina -small amount of bright red blood in the vagina. No pooling of blood noted. Bright red blood on perineum Cervix - visually closed, no active bleeding  Bimanual exam: deferred  Chaperone present for exam.   Musculoskeletal: Normal range of motion.  Neurological: She is  alert and oriented to person, place, and time.  Skin: Skin is warm. She is not diaphoretic.  Psychiatric: Her behavior is normal.   Fetal Tracing: Baseline: 140 bpm  Variability: moderate  Accelerations: 15x15 Decelerations: none Toco: quiet   MAU Course  Procedures  None  MDM  Discussed patient with Dr. Harrington Challenger.  Betamethasone  NST  UA  O positive blood type   Assessment and Plan   A:  1. Vaginal bleeding in pregnancy, second trimester   2. Placenta previa antepartum in second trimester     P:  Admit to high risk OB Betamethasone now and repeat in 24 hours  Further orders per Dr. Harrington Challenger.    Lezlie Lye, NP 02/22/2016 6:34 PM

## 2016-02-22 NOTE — MAU Note (Addendum)
Pt states she has been having vaginal bleeding since around 1030 or 1100 this morning.  Pt denies any cramping but states she has some internal vaginal pain.  Pt states the clot she had this morning was the size of a soft ball and she has a picture of it on her phone.  Pt states that when she gave a urine sample she had a clot the size of a quarter.  Pt states she has a complete previa.  Pt states she saw MFM on 02/13/2016 and was told that if she had another bleed she may need to be admitted to the hospital.

## 2016-02-22 NOTE — H&P (Addendum)
Sarah Berry is a 30 y.o. female presenting for vaginal bleeding  30 yo G1P0 @ 23+5 presents for recurrent vaginal bleeding. Sarah Berry' pregnancy has been complicated by multiple episodes of vaginal bleeding. Pt was diagnosed with large Rhodes at 12 weeks and placenta previa. At 21 weeks she experienced a 2nd spontaneous bleeding episode. She was offered admission at that time but declined. Today she had another spontaneous bleeding episode and came in for evaluation.   In MAU FHR is reassuring for 23 weeks. No active bleeding on exam but evidence of bleeding is noted by NP on perineum. Pt denies significant cramping  In addition to vaginal bleeding, the patient's pregnancy was conceived with invitro fertilization. She also has known uterine fibroids OB History    Gravida Para Term Preterm AB Living   1         0   SAB TAB Ectopic Multiple Live Births                 Past Medical History:  Diagnosis Date  . Fibroid   . Infertility, female, primary   . Newborn product of in vitro fertilization (IVF) pregnancy    Past Surgical History:  Procedure Laterality Date  . egg retrieval    . MYOMECTOMY    . WISDOM TOOTH EXTRACTION     Family History: family history includes Cancer in her paternal grandmother. Social History:  reports that she has never smoked. She has never used smokeless tobacco. She reports that she does not drink alcohol or use drugs.     Maternal Diabetes: not yet evaluated Genetic Screening: Normal Maternal Ultrasounds/Referrals: Abnormal:  Findings:   Other: Complete placenta previa Fetal Ultrasounds or other Referrals:  None Maternal Substance Abuse:  No Significant Maternal Medications:  None Significant Maternal Lab Results:  None Other Comments:  None  ROSAs above History   Blood pressure 127/79, pulse 87, temperature 97.7 F (36.5 C), temperature source Oral, resp. rate 18, last menstrual period 09/07/2015, SpO2 97 %. Exam Physical Exam  Prenatal  labs: ABO, Rh: --/--/O POS (09/26 [id number]) Antibody:  Negative Rubella:  Immune RPR:   NR HBsAg:   Neg HIV:   NR GBS:     Assessment/Plan: 30 yo G1P0 @ 23+5 with 3rd episode of vaginal bleeding and known placenta previa 1) Admit to Antepartum 2) BMZ for FLM 3) SCDs for DVT prophylaxis 4) T&S 5) [place] for EFW and evaluation of placenta in am. Last US done by MFM 02/13/2016 in follow-up of previa due to concern that a vasa previa was seen on [place] in the office. No vasa previa was noted. Complete previa without obvious Subchorionic fluid noted. EFW 59% at that point. Given only 10 days later, repeat EFW not necessary  Sarah Clemence H. 02/22/2016, 6:21 PM

## 2016-02-23 ENCOUNTER — Inpatient Hospital Stay (HOSPITAL_COMMUNITY): Payer: BLUE CROSS/BLUE SHIELD

## 2016-02-23 MED ORDER — FAMOTIDINE 20 MG PO TABS
20.0000 mg | ORAL_TABLET | Freq: Two times a day (BID) | ORAL | Status: DC | PRN
  Administered 2016-02-23 – 2016-02-28 (×3): 20 mg via ORAL
  Filled 2016-02-23 (×3): qty 1

## 2016-02-23 NOTE — Progress Notes (Signed)
Name: Sarah Berry Medical Record Number:  [id number] Date of Birth: 01/22/1986 Date of Service: 02/23/2016  30 y.o. G1P0 [redacted]w[redacted]d HD#1 admitted for 2 WKS, BLEEDING with complete previa.  Pt currently stable with no c/o. She denies contractions, no vaginal bleeding overnight, no leaking of fluid. Reports good FM.  The patient's past medical history and prenatal records were reviewed.  Additional issues addressed and updated today: Patient Active Problem List   Diagnosis Date Noted  . Vaginal bleeding in pregnancy, second trimester 02/22/2016   Family History  Problem Relation Age of Onset  . Cancer Paternal Grandmother    Social History   Social History  . Marital status: Married    Spouse name: N/A  . Number of children: N/A  . Years of education: N/A   Social History Main Topics  . Smoking status: Never Smoker  . Smokeless tobacco: Never Used  . Alcohol use No  . Drug use: No  . Sexual activity: Not Currently    Birth control/ protection: None   Other Topics Concern  . None   Social History Narrative  . None   Vitals:   02/22/16 2340 02/23/16 0803  BP: 102/62 112/73  Pulse: 85 87  Resp: 16 16  Temp: 98.2 F (36.8 C) 98.4 F (36.9 C)     Physical Examination:   Vitals:   02/22/16 2340 02/23/16 0803  BP: 102/62 112/73  Pulse: 85 87  Resp: 16 16  Temp: 98.2 F (36.8 C) 98.4 F (36.9 C)   General appearance - alert, well appearing, and in no distress and oriented to person, place, and time Mental status - alert, oriented to person, place, and time, normal mood, behavior, speech, dress, motor activity, and thought processes  Abd  Soft, gravid, nontender Ex SCDs FHTs  150s, appropriate variability for GA Toco  none  Cervix: not evaluated  Results for orders placed or performed during the hospital encounter of 02/22/16 (from the past 24 hour(s))  Urinalysis, Routine w reflex microscopic (not at [facility])     Status: Abnormal   Collection Time: 02/22/16   5:22 PM  Result Value Ref Range   Color, Urine YELLOW YELLOW   APPearance HAZY (A) CLEAR   Specific Gravity, Urine 1.020 1.005 - 1.030   pH 6.5 5.0 - 8.0   Glucose, UA NEGATIVE NEGATIVE mg/dL   Hgb urine dipstick LARGE (A) NEGATIVE   Bilirubin Urine NEGATIVE NEGATIVE   Ketones, ur NEGATIVE NEGATIVE mg/dL   Protein, ur NEGATIVE NEGATIVE mg/dL   Nitrite NEGATIVE NEGATIVE   Leukocytes, UA NEGATIVE NEGATIVE  Urine microscopic-add on     Status: Abnormal   Collection Time: 02/22/16  5:22 PM  Result Value Ref Range   Squamous Epithelial / LPF 0-5 (A) NONE SEEN   WBC, UA 0-5 0 - 5 WBC/hpf   RBC / HPF TOO NUMEROUS TO COUNT 0 - 5 RBC/hpf   Bacteria, UA FEW (A) NONE SEEN  Type and screen [place]     Status: None   Collection Time: 02/22/16  6:45 PM  Result Value Ref Range   ABO/RH(D) O POS    Antibody Screen NEG    Sample Expiration 02/25/2016   CBC on admission     Status: Abnormal   Collection Time: 02/22/16  6:45 PM  Result Value Ref Range   WBC 9.9 4.0 - 10.5 K/uL   RBC 3.44 (L) 3.87 - 5.11 MIL/uL   Hemoglobin 10.2 (L) 12.0 - 15.0 g/dL  HCT 29.9 (L) 36.0 - 46.0 %   MCV 86.9 78.0 - 100.0 fL   MCH 29.7 26.0 - 34.0 pg   MCHC 34.1 30.0 - 36.0 g/dL   RDW 12.7 11.5 - 15.5 %   Platelets 245 150 - 400 K/uL    A:  HD#1  [redacted]w[redacted]d with complete previa, with intermittent bleeding during her second trimester.  P: MFM ultrasound today Will be steroid complete today Close monitoring for bleed Discussed with patient previous MFM recommendation for extended hospital stay if another vaginal bleed occurred.  Will evaluate week by week the possibility of disposition to home.  SW consult as patient had to resign from her job and she will be uninsured by the end of the month.    Armilda Vanderlinden STACIA

## 2016-02-23 NOTE — Clinical SW OB High Risk (Signed)
Clinical Social Work Antenatal   Clinical Social Worker:  Alphonzo Cruise, [place] Date/Time:  02/23/2016, 2:30 PM Gestational Age on Admission:  30 y.o. Admitting Diagnosis: Vaginal bleeding in pregnancy   Expected Delivery Date:  06/15/16, but patient states the plan is to not exceed 36 weeks.  Family/Home Environment  Home Address: [address], [place], [place] [address]  Household Member/Support Name: Gerald Stabs  Relationship:  Spouse Other Support: Patient reports, "everyone is in [place]."  She expects family members to take turns coming to visit.  FOB states his brother and brother's wife live locally and are supportive.   Psychosocial Data  Information Source:  Family Interview Resources:    Employment: Patient states she was working at [employer], but has had to resign from her employment there due to needed bed rest for pregnancy.  She states she is not eligible for FMLA because she has only been employed for approximately 80 days.  FOB works for "a [place]" called [employer].      Medicaid [facility]): CSW recommends speaking with a hospital financial counselor.  Patient agrees.  CSW made referral to [facility] [place]/Financial Counselor.  Other Resources:   FOB thinks he can add patient to his insurance.  Cultural/Environment Issues Impacting Care: None stated.     Strengths/Weaknesses/Factors to Consider  Concerns Related to Hospitalization: Patient's main concern is the cost of this hospitalization, since she has been told that the recommendation is to remain hospitalized until delivery.  She is also concerned about being confined to a hospital room for an extended period of time as she describes herself as "outdoorsy."  Patient is worried about being away from her dogs as well.  CSW checked on dog visitation policy and provided a copy to the patient.  Patient's face lit up as she told CSW, "this is the best news I've gotten all day."    Previous Pregnancies/Feelings  Towards Pregnancy?  Concerns related to being/becoming a mother?: Patient states that this pregnancy was IVF and that they feel they have had to fight for this baby every step of the way.  Social Support (FOB? Who is/will be helping with baby/other kids?): FOB/Chris appears very supportive.  He was involved in the conversation today.  This is patient's first pregnancy.  Couples Relationship (describe): Couple seems very supportive of each other.   Recent Stressful Life Events (life changes in past year?): None stated other than complications with pregnancy and inability to continue working.   Prenatal Care/Education/Home Preparations: Not discussed at this time.   Domestic Violence (of any type):  No If Yes to Domestic Violence, Describe/Action Plan:     Substance Use During Pregnancy: No (If Yes, Complete SBIRT)  Complete PHQ-9 (Depresssion Screening): CSW does not feel this screening tool is warranted at this time and will continue to monitor for signs of depression.    Follow-up Recommendations: CSW recommends patient and FOB speak to their insurance companies and keep a log of everyone they speak with including name, date, and information given.  CSW recommends talking with hospital financial counselor and made referral to [facility]. [place].  CSW recommends calling CSW if they feel they would like to process their emotions at any time.  CSW recommends allowing patient's dogs visit to improve her emotional health.     Patient Advised/Response: Patient and FOB were pleasant and easy to engage.  They are in agreement of CSW's recommendations and know they can contact CSW for support at any time.   Other:  Clinical Assessment/Plan: Patient appears to have a good understanding of her medical situation and recommendations for care.  She reports that she has been emotional this morning, which she thinks is normal.  She is most concerned about the cost of this hospitalization.  CSW encouraged  her to allow herself to be emotional and to let CSW and or her doctor know if she feels she is not coping with her situation at any point in time.  CSW talked expectations, control, and staying in the moment in attempt to help patient have a positive frame of mind. FOB reports that patient was recently on his insurance plan, but once she was employed by [employer], she was offered better insurance benefits than his company provides.  He reports that her deductible was $400 as opposed to $6000.  His understanding from an email she received this morning is that she can continue to have her insurance through [place] for 45 more days.  He also thinks that he is able to add her to his insurance again.  He will follow up.  Patient and FOB agreeable to talk with financial counselor at [facility] to discuss potential Medicaid eligibility.  CSW explained ongoing support services offered by CSW and gave contact information.

## 2016-02-24 MED ORDER — SODIUM CHLORIDE 0.9% FLUSH
3.0000 mL | Freq: Two times a day (BID) | INTRAVENOUS | Status: DC
  Administered 2016-02-24 – 2016-02-28 (×8): 3 mL via INTRAVENOUS

## 2016-02-24 NOTE — Progress Notes (Signed)
UR chart review completed.  

## 2016-02-24 NOTE — Consult Note (Signed)
[place] 02/24/2016    10:29 PM  Neonatal Medicine Consultation         Sarah Berry          MRN:  [id number]  I was called at the request of the patient's obstetrician (Dr. Harrington Challenger) to speak to this patient due to potential premature birth as early as 43 weeks.  The patient's prenatal course includes a placenta previa, with recurrent vaginal bleeding.  She is 24 0/7 weeks currently.  She is admitted to antenatal unit, and is receiving treatment that includes betamethasone (10/15 and 10/16).  The baby is a girl.  I reviewed expectations for a baby born at 24+ weeks, including survival, length of stay, morbidities such as respiratory distress, IVH, infection, feeding intolerance, retinopathy.  I described how we provide respiratory and feeding support.  Mom plans to breast feed, which I encouraged as best for the baby (with supplementations for needed calories).  I let mom know that the baby's outlook generally improves the longer she remains undelivered.  I spent 20 minutes reviewing the record, speaking to the patient, and entering appropriate documentation.  More than 50% of the time was spent face to face with patient.   _____________________ Electronically Signed By: Roosevelt Locks, MD Neonatologist

## 2016-02-25 DIAGNOSIS — O4402 Placenta previa specified as without hemorrhage, second trimester: Secondary | ICD-10-CM | POA: Diagnosis present

## 2016-02-25 LAB — TYPE AND SCREEN
ABO/RH(D): O POS
Antibody Screen: NEGATIVE

## 2016-02-25 NOTE — Progress Notes (Signed)
HD#4 Placenta Previa - admitted for bleeding Pt reports this is her 6th significant bleed, noting a palm sized clot at home.  She reports no bleeding currently and not documented since admission.  She denies ctx.  Good FM. Fetal monitoring q shift T&S active Ongoing plan unclear, will request MFM consultation for inpatient vs. Potential outpt mgmt after interval of no bleed. Discussed continuous SCDs with pt for DVT prevention Cont. Other routine care.

## 2016-02-26 ENCOUNTER — Ambulatory Visit (HOSPITAL_COMMUNITY)
Admit: 2016-02-26 | Discharge: 2016-02-26 | Disposition: A | Discharge status: Home / Self Care | Payer: BLUE CROSS/BLUE SHIELD | Attending: Obstetrics and Gynecology | Admitting: Obstetrics and Gynecology

## 2016-02-26 NOTE — Progress Notes (Addendum)
Patient ID: Sarah Berry, female   DOB: June 10, 1985, 30 y.o.   MRN: [id number]   S: Pt noting some uterine activity. Uncertain if contractions or fetal movement. Pts prior episodes of bleeding/clotting have been preceded by cramping/discomfort low down. Fetal movement is both reassuring and anxiety provoking because she;s uncertain if bleeding will ensue. No active bleeding currently. O:  Vitals:   02/25/16 2013 02/25/16 2015 02/25/16 2258 02/26/16 0957  BP: 119/62  (!) 103/59 116/67  Pulse: 99 (!) 101 85 94  Resp: 18  18 20   Temp: 98.4 F (36.9 C)  98 F (36.7 C) 98.7 F (37.1 C)  TempSrc: Oral  Oral Oral  SpO2:  98%    Weight:      Height:       AOx3, NAD Adb soft/NT/ND  A/P 1) Complete previa with bleeding episode. Pt has had at least 2 significant bleeding episodes prior to this hospitalization (1st inpatient hospitalization). Other minor bleeding episodes are also noted in her office chart. 2) BMZ x 2 3) Current T&S 4) SCDs for DVT prophylaxis 5) Prior MFM note from Dr. Lisbeth Renshaw commented on consideration of long term inpatient hospitalization if another bleeding episode occurred. The duration of "long term" is uncertain, 1-2 weeks versus until delivery. MFM was asked to clarify long-term. In communication through Pts RN, Dr. Burnett Harry gave consideration for hospitalization for 1-2 weeks to allow for stabilization and cessation of bleeding from this particular episode. If Pt remains stable, can consider discharge home. 6) Bilateral UTD noted on [place] @ 22 weeks. Repeat US for evaluation of UTD will be necessary 6 weeks from that [place] (around 3rd week of November). [place] for EFW 3 weeks after [place] for growth on 10/6n (1st week NOvember) 7) MOD discussed with patient. At this time anticipate cesarean section.

## 2016-02-27 NOTE — Progress Notes (Signed)
Name: Sarah Berry Medical Record Number:  [id number] Date of Birth: 06/07/1985 Date of Service: 02/27/2016  30 y.o. G1P0 [redacted]w[redacted]d HD#4 admitted for 23 WKS bleeding with complete previa.  Pt currently stable with no complaints.  She has not had any further episodes of bleeding. She denies contractions, no vaginal bleeding, no leaking of fluid. Reports good FM.  The patient's past medical history and prenatal records were reviewed.  Additional issues addressed and updated today: Patient Active Problem List   Diagnosis Date Noted  . Complete placenta previa nos or without hemorrhage, second trimester 02/25/2016  . Vaginal bleeding in pregnancy, second trimester 02/22/2016   Family History  Problem Relation Age of Onset  . Cancer Paternal Grandmother    Social History   Social History  . Marital status: Married    Spouse name: N/A  . Number of children: N/A  . Years of education: N/A   Social History Main Topics  . Smoking status: Never Smoker  . Smokeless tobacco: Never Used  . Alcohol use No  . Drug use: No  . Sexual activity: Not Currently    Birth control/ protection: None   Other Topics Concern  . None   Social History Narrative  . None   Vitals:   02/26/16 2335 02/27/16 0906  BP: 115/67 124/74  Pulse: 87 78  Resp: 18 16  Temp: 97.4 F (36.3 C) 98.6 F (37 C)     Physical Examination:   Vitals:   02/26/16 2335 02/27/16 0906  BP: 115/67 124/74  Pulse: 87 78  Resp: 18 16  Temp: 97.4 F (36.3 C) 98.6 F (37 C)   General appearance - alert, well appearing, and in no distress and oriented to person, place, and time Mental status - alert, oriented to person, place, and time, normal mood, behavior, speech, dress, motor activity, and thought processes Abd  Soft, gravid, nontender Ex SCDs FHTs  150s, moderate variability appropriate for GA Toco  none  Cervix: not evaluated  No results found for this or any previous visit (from the past 24 hour(s)).  A:   HD#4  [redacted]w[redacted]d with complete previa, 6 episodes of heavy bleeding at home during the second trimester. Presently stable no episodes of bleeding this hospitalization.  P: Continue close monitoring As d/w Dr. Burnett Harry, MFM will re-evaluate next week and possible d/c home with modified bedrest Steroid complete for [facility]   Hadessah Grennan STACIA

## 2016-02-28 LAB — TYPE AND SCREEN
ABO/RH(D): O POS
ANTIBODY SCREEN: NEGATIVE

## 2016-02-28 NOTE — Progress Notes (Signed)
Name: Sarah Berry Medical Record Number:  [id number] Date of Birth: Jul 13, 1985 Date of Service: 02/28/2016  30 y.o. G1P0 [redacted]w[redacted]d HD#5 admitted for 23 weeks Bleeding with complete previa.   Pt currently stable without any bleeding during this hospitalization.  She denies contractions,no leaking of fluid. Reports good FM.  The patient's past medical history and prenatal records were reviewed.  Additional issues addressed and updated today: Patient Active Problem List   Diagnosis Date Noted  . Complete placenta previa nos or without hemorrhage, second trimester 02/25/2016  . Vaginal bleeding in pregnancy, second trimester 02/22/2016   Vitals:   02/28/16 0300 02/28/16 0600  BP:  93/64  Pulse:  87  Resp: 16 16  Temp:  98.1 F (36.7 C)    Physical Examination:   Vitals:   02/28/16 0300 02/28/16 0600  BP:  93/64  Pulse:  87  Resp: 16 16  Temp:  98.1 F (36.7 C)   General appearance - alert, well appearing, and in no distress and oriented to person, place, and time Mental status - alert, oriented to person, place, and time, normal mood, behavior, speech, dress, motor activity, and thought processes  Abd  Soft, gravid, nontender Ex SCDs FHTs  150s, moderate variability appropriate for GA Toco  none  Cervix: not evaluated  No results found for this or any previous visit (from the past 24 hour(s)).  A:  HD#5  [redacted]w[redacted]d with complete previa, several episodes of hemorrhage at home.  P: Steroid complete Patient counseled about disposition home tomorrow with modified bedrest at home.  MFM scan in 3-4 weeks.   Genetta Fiero STACIA

## 2016-02-29 NOTE — Progress Notes (Signed)
Discharge instructions given, questions answered, states understanding, signed and given copy. 

## 2016-02-29 NOTE — Discharge Summary (Signed)
Obstetric Discharge Summary Reason for Admission: Second trimester bleeding Prenatal Procedures: NST and ultrasound Intrapartum Procedures: none Postpartum Procedures: n/a Complications-Operative and Postpartum: n/a Hemoglobin  Date Value Ref Range Status  02/22/2016 10.2 (L) 12.0 - 15.0 g/dL Final   HCT  Date Value Ref Range Status  02/22/2016 29.9 (L) 36.0 - 46.0 % Final    Physical Exam:  General appearance - alert, well appearing, and in no distress and oriented to person, place, and time Mental status - alert, oriented to person, place, and time, normal mood, behavior Abd  Soft, gravid, nontender Ex SCDs FHTs  150s, moderate variability accels no decels Toco   No ctx  Cervix: not evaluated  Discharge Diagnoses: Antepartum bleeding  Discharge Information: Date: 02/29/2016 Activity: pelvic rest Diet: routine Medications: PNV Condition: stable Instructions: bleeding precautions, f/u in office this week Discharge to: home  Patient still pregnant This patient has no babies on file.   Sarah Berry, [place] 02/29/2016, 11:26 AM

## 2016-02-29 NOTE — Progress Notes (Signed)
Name: Tasheen Dimitrov Medical Record Number:  [id number] Date of Birth: 08-Dec-1985 Date of Service: 02/29/2016  30 y.o. G1P0 [redacted]w[redacted]d HD#6 admitted  At 23 weeks for bleeding, with known complete previa.  Patient is stable without any bleeding during this hospitalization. She denies contractions, no vaginal bleeding, no leaking of fluid. Reports good FM.  The patient's past medical history and prenatal records were reviewed.  Additional issues addressed and updated today: Patient Active Problem List   Diagnosis Date Noted  . Complete placenta previa nos or without hemorrhage, second trimester 02/25/2016  . Vaginal bleeding in pregnancy, second trimester 02/22/2016    Physical Examination:   Vitals:   02/28/16 2230 02/28/16 2307  BP:  113/78  Pulse: 98 91  Resp:  16  Temp:     General appearance - alert, well appearing, and in no distress and oriented to person, place, and time Mental status - alert, oriented to person, place, and time, normal mood, behavior Abd  Soft, gravid, nontender Ex SCDs FHTs  150s, moderate variability accels no decels Toco   No ctx  Cervix: not evaluated  Results for orders placed or performed during the hospital encounter of 02/22/16 (from the past 24 hour(s))  Type and screen [place]     Status: None   Collection Time: 02/28/16  6:03 PM  Result Value Ref Range   ABO/RH(D) O POS    Antibody Screen NEG    Sample Expiration 03/02/2016     A:  HD#6  [redacted]w[redacted]d with complete previa, h/o recurrent bleeds, currently stable with no recent bleed x one week.  P: Will discharge patient home today with modified bedrest at home We discussed bleeding precautions and if she has another bleeding, will admit to hospital until delivery.  She will make a f/u appointment with me for 1HR GTT  MFM scan in 2 weeks.   Nayib Remer STACIA

## 2016-03-26 ENCOUNTER — Encounter (HOSPITAL_COMMUNITY): Payer: Self-pay

## 2016-03-26 ENCOUNTER — Other Ambulatory Visit (HOSPITAL_COMMUNITY): Payer: Self-pay | Admitting: Maternal and Fetal Medicine

## 2016-03-26 ENCOUNTER — Ambulatory Visit (HOSPITAL_COMMUNITY)
Admission: RE | Admit: 2016-03-26 | Discharge: 2016-03-26 | Disposition: A | Discharge status: Home / Self Care | Payer: BLUE CROSS/BLUE SHIELD | Source: Ambulatory Visit | Attending: Obstetrics | Admitting: Obstetrics

## 2016-03-26 DIAGNOSIS — Z3A28 28 weeks gestation of pregnancy: Secondary | ICD-10-CM

## 2016-03-26 DIAGNOSIS — Z0489 Encounter for examination and observation for other specified reasons: Secondary | ICD-10-CM

## 2016-03-26 DIAGNOSIS — O09813 Supervision of pregnancy resulting from assisted reproductive technology, third trimester: Secondary | ICD-10-CM | POA: Insufficient documentation

## 2016-03-26 DIAGNOSIS — O358XX0 Maternal care for other (suspected) fetal abnormality and damage, not applicable or unspecified: Secondary | ICD-10-CM

## 2016-03-26 DIAGNOSIS — O4413 Placenta previa with hemorrhage, third trimester: Secondary | ICD-10-CM

## 2016-03-26 DIAGNOSIS — O35EXX0 Maternal care for other (suspected) fetal abnormality and damage, fetal genitourinary anomalies, not applicable or unspecified: Secondary | ICD-10-CM

## 2016-03-26 DIAGNOSIS — O3413 Maternal care for benign tumor of corpus uteri, third trimester: Secondary | ICD-10-CM | POA: Diagnosis not present

## 2016-03-26 DIAGNOSIS — IMO0002 Reserved for concepts with insufficient information to code with codable children: Secondary | ICD-10-CM

## 2016-03-26 DIAGNOSIS — D259 Leiomyoma of uterus, unspecified: Secondary | ICD-10-CM | POA: Insufficient documentation

## 2016-03-26 DIAGNOSIS — O4400 Placenta previa specified as without hemorrhage, unspecified trimester: Secondary | ICD-10-CM

## 2016-03-26 DIAGNOSIS — Z362 Encounter for other antenatal screening follow-up: Secondary | ICD-10-CM | POA: Diagnosis not present

## 2016-04-07 ENCOUNTER — Other Ambulatory Visit: Payer: Self-pay | Admitting: Obstetrics & Gynecology

## 2016-04-20 ENCOUNTER — Other Ambulatory Visit: Payer: Self-pay | Admitting: Obstetrics & Gynecology

## 2016-05-06 ENCOUNTER — Encounter (HOSPITAL_COMMUNITY): Payer: Self-pay

## 2016-05-07 ENCOUNTER — Encounter (HOSPITAL_COMMUNITY): Payer: Self-pay

## 2016-05-08 ENCOUNTER — Inpatient Hospital Stay (HOSPITAL_COMMUNITY): Payer: BLUE CROSS/BLUE SHIELD

## 2016-05-08 ENCOUNTER — Inpatient Hospital Stay (HOSPITAL_COMMUNITY)
Admission: AD | Admit: 2016-05-08 | Discharge: 2016-05-09 | Disposition: A | Discharge status: Home / Self Care | Payer: BLUE CROSS/BLUE SHIELD | Source: Ambulatory Visit | Attending: Obstetrics and Gynecology | Admitting: Obstetrics and Gynecology

## 2016-05-08 ENCOUNTER — Encounter (HOSPITAL_COMMUNITY): Payer: Self-pay | Admitting: *Deleted

## 2016-05-08 DIAGNOSIS — Z79899 Other long term (current) drug therapy: Secondary | ICD-10-CM | POA: Diagnosis not present

## 2016-05-08 DIAGNOSIS — O4703 False labor before 37 completed weeks of gestation, third trimester: Secondary | ICD-10-CM | POA: Diagnosis not present

## 2016-05-08 DIAGNOSIS — R109 Unspecified abdominal pain: Secondary | ICD-10-CM | POA: Diagnosis present

## 2016-05-08 DIAGNOSIS — Z882 Allergy status to sulfonamides status: Secondary | ICD-10-CM | POA: Diagnosis not present

## 2016-05-08 DIAGNOSIS — Z3A34 34 weeks gestation of pregnancy: Secondary | ICD-10-CM | POA: Diagnosis not present

## 2016-05-08 DIAGNOSIS — O479 False labor, unspecified: Secondary | ICD-10-CM | POA: Diagnosis present

## 2016-05-08 DIAGNOSIS — O26893 Other specified pregnancy related conditions, third trimester: Secondary | ICD-10-CM | POA: Insufficient documentation

## 2016-05-08 DIAGNOSIS — Z3689 Encounter for other specified antenatal screening: Secondary | ICD-10-CM

## 2016-05-08 LAB — URINALYSIS, ROUTINE W REFLEX MICROSCOPIC
BACTERIA UA: NONE SEEN
Bilirubin Urine: NEGATIVE
GLUCOSE, UA: NEGATIVE mg/dL
KETONES UR: NEGATIVE mg/dL
Leukocytes, UA: NEGATIVE
NITRITE: NEGATIVE
PROTEIN: NEGATIVE mg/dL
Specific Gravity, Urine: 1.012 (ref 1.005–1.030)
pH: 6 (ref 5.0–8.0)

## 2016-05-08 MED ORDER — BUTORPHANOL TARTRATE 1 MG/ML IJ SOLN: 1.0000 mg | INTRAMUSCULAR | Status: DC | PRN

## 2016-05-08 NOTE — MAU Provider Note (Signed)
History    Sarah Berry is a G1P0 at 34 weeks and 5 days by embryo transfer here with complaints of left-sided abdominal pain.  CSN: [id number]  Arrival date and time: 05/08/16 1935   None     Chief Complaint  Patient presents with  . Contractions   Abdominal Pain  This is a new problem. The current episode started today. The problem occurs intermittently. The problem has been unchanged. The pain is located in the LLQ and left flank. The pain is at a severity of 8/10. The quality of the pain is aching, cramping and burning. The abdominal pain does not radiate. Pertinent negatives include no anorexia, arthralgias, belching, constipation, diarrhea, dysuria, fever, flatus, frequency, headaches, hematochezia, hematuria, melena, myalgias, nausea, vomiting or weight loss. Nothing aggravates the pain. The pain is relieved by nothing.    OB History    Gravida Para Term Preterm AB Living   1         0   SAB TAB Ectopic Multiple Live Births                  Past Medical History:  Diagnosis Date  . Fibroid   . Infertility, female, primary   . Newborn product of in vitro fertilization (IVF) pregnancy   . Placenta previa     Past Surgical History:  Procedure Laterality Date  . egg retrieval    . MYOMECTOMY    . WISDOM TOOTH EXTRACTION      Family History  Problem Relation Age of Onset  . Cancer Paternal Grandmother   . Hypertension Mother   . Hypertension Father     Social History  Substance Use Topics  . Smoking status: Never Smoker  . Smokeless tobacco: Never Used  . Alcohol use No    Allergies:  Allergies  Allergen Reactions  . Bactrim [Sulfamethoxazole-Trimethoprim] Other (See Comments)    "childhood allergy"    Prescriptions Prior to Admission  Medication Sig Dispense Refill Last Dose  . cetirizine (ZYRTEC) 10 MG tablet Take 10 mg by mouth daily as needed for allergies.    Not Taking  . diphenhydrAMINE (BENADRYL) 25 MG tablet Take 25 mg by mouth every 6  (six) hours as needed for allergies or sleep.    Not Taking  . Prenatal Vit-Fe Fumarate-FA (PRENATAL MULTIVITAMIN) TABS tablet Take 1 tablet by mouth at bedtime.   Taking  . RaNITidine HCl (ZANTAC PO) Take by mouth.   Taking    Review of Systems  Constitutional: Negative for fever and weight loss.  HENT: Negative.   Eyes: Negative.   Respiratory: Negative.   Cardiovascular: Negative.   Gastrointestinal: Positive for abdominal pain. Negative for anorexia, constipation, diarrhea, flatus, hematochezia, melena, nausea and vomiting.  Genitourinary: Negative for dysuria, frequency and hematuria.  Musculoskeletal: Negative for arthralgias and myalgias.  Skin: Negative.   Neurological: Negative.  Negative for headaches.  Endo/Heme/Allergies: Negative.   Psychiatric/Behavioral: Negative.    Physical Exam   Blood pressure 134/86, pulse 103, temperature 98.2 F (36.8 C), resp. rate 18, height 5' 7.5" (1.715 m), weight 86.8 kg (191 lb 6.4 oz), last menstrual period 09/07/2015.  Physical Exam  Constitutional: She is oriented to person, place, and time. She appears well-developed.  HENT:  Head: Normocephalic.  Eyes: Pupils are equal, round, and reactive to light.  Neck: Normal range of motion.  Cardiovascular: Normal rate.   Respiratory: Effort normal. No respiratory distress. She has no wheezes. She has no rales. She exhibits no tenderness.  GI: Soft. She exhibits no distension and no mass. There is no tenderness. There is no rebound and no guarding.  Musculoskeletal: Normal range of motion.  Neurological: She is alert and oriented to person, place, and time.  Skin: Skin is warm and dry.  Psychiatric: She has a normal mood and affect.    MAU Course  Procedures  MDM -NST: reactive, FHR is 150 bpm with accelerations and moderate variability, no decelerations. No contractions.  -Vaginal exam deferred due to previa -[place]: ob limited: no signs of abruption  Assessment and Plan   1.  Braxton Hick's contraction   2. Abdominal pain   3. Ultrasound scan to evaluate placenta for abruption   4. [redacted] weeks gestation of pregnancy    2. [place] is reassuring; no sign of placental abruption.  3. Patient is anxious but relieved at [place] results and states that the pain is now much improved.  4. Patient to be discharged home with instructions to keep her next prenatal visit.  5. Reviewed when to return to MAU (contractions, bleeding, decreased fetal movements) 6. Plan of care discussed with Dr. Alwyn Pea, who agrees.    Mervyn Skeeters Marsel Gail CNM 05/08/2016, 8:26 PM

## 2016-05-08 NOTE — MAU Note (Signed)
Think I have been contracting for an hour but unsure. Feeling more pain LLQ and feels like fire going down L flank. Some brown d/c. Have had 7 bleeds and hosp 4 times for bleeding from previa

## 2016-05-08 NOTE — Discharge Instructions (Signed)
Placenta Previa Placenta previa is a condition in which the placenta implants in the lower part of the uterus in pregnant women. The placenta either partially or completely covers the opening to the cervix. This is a problem because the baby must pass through the cervix during delivery. There are three types of placenta previa:  Marginal placenta previa. The placenta reaches within an inch (2.5 cm) of the cervical opening but does not cover it.  Partial placenta previa. The placenta covers part of the cervical opening.  Complete placenta previa. The placenta covers the entire cervical opening. If the previa is marginal or partial and it is diagnosed in the first half of pregnancy, the placenta may move into a normal position as the pregnancy progresses and may no longer cover the cervix. It is important to keep all prenatal visits with your health care provider so you can be more closely monitored. What are the causes? The cause of this condition is not known. What increases the risk? This condition is more likely to develop in women who:  Are carrying more than one baby (multiples).  Have an abnormally shaped uterus.  Have scars on the lining of the uterus.  Have had surgeries involving the uterus, such as a cesarean delivery.  Have delivered a baby before.  Have a history of placenta previa.  Have smoked or used cocaine during pregnancy.  Are age 35 or older during pregnancy. What are the signs or symptoms? The main symptom of this condition is sudden, painless vaginal bleeding during the second half of pregnancy. The amount of bleeding can be very light at first, and it usually stops on its own. Heavier bleeding episodes may also happen. Some women with placenta previa may have no bleeding at all. How is this diagnosed?  This condition is diagnosed:  From an ultrasound. This test uses sound waves to find where the placenta is located before you have any bleeding  episodes.  During a checkup after vaginal bleeding is noticed.  If you are diagnosed with a partial or complete previa, digital exams with fingers will generally be avoided. Your health care provider will still perform a speculum exam.  If you did not have an ultrasound during your pregnancy, placenta previa may not be diagnosed until bleeding occurs during labor. How is this treated? Treatment for this condition may include:  Decreased activity.  Bed rest at home or in the hospital.  Pelvic rest. Nothing is placed inside the vagina during pelvic rest. This means not having sex and not using tampons or douches.  A blood transfusion to replace blood that you have lost (maternal blood loss).  A cesarean delivery. This may be performed if:  The bleeding is heavy and cannot be controlled.  The placenta completely covers the cervix.  Medicines to stop premature labor or to help the baby's lungs to mature. This treatment may be used if you need delivery before your pregnancy is full-term. Your treatment will be decided based on:  How much you are bleeding, or whether the bleeding has stopped.  How far along you are in your pregnancy.  The condition of your baby.  The type of placenta previa that you have. Follow these instructions at home:  Get plenty of rest and lessen activity as told by your health care provider.  Stay on bed rest for as long as told by your health care provider.  Do not have sex, use tampons, use a douche, or place anything inside of your   vagina if your health care provider recommended pelvic rest.  Take over-the-counter and prescription medicines as told by your health care provider.  Keep all follow-up visits as told by your health care provider. This is important. Get help right away if:  You have vaginal bleeding, even if in small amounts and even if you have no pain.  You have cramping or regular contractions.  You have pain in your abdomen or  your lower back.  You have a feeling of increased pressure in your pelvis.  You have increased watery or bloody mucus from the vagina. This information is not intended to replace advice given to you by your health care provider. Make sure you discuss any questions you have with your health care provider. Document Released: 04/26/2005 Document Revised: 01/14/2016 Document Reviewed: 11/08/2015 Elsevier Interactive Patient Education  2017 [employer]  

## 2016-05-09 DIAGNOSIS — O479 False labor, unspecified: Secondary | ICD-10-CM | POA: Diagnosis not present

## 2016-05-10 DIAGNOSIS — Z8679 Personal history of other diseases of the circulatory system: Secondary | ICD-10-CM

## 2016-05-10 HISTORY — DX: Personal history of other complications of pregnancy, childbirth and the puerperium: Z86.79

## 2016-05-10 NOTE — L&D Delivery Note (Signed)
Cesarean Section Procedure Note  Indications: 36 week 1 day intrauterine pregnancy with complete placenta previa and bleeding throughout the pregnancy.  Pre-operative Diagnosis: complete placenta previa; second and third trimester bleeding  Post-operative Diagnosis: same  Surgeon: Caffie Damme   Assistants: Vanessa Kick  Anesthesia: Spinal anesthesia  ASA Class: 2  Procedure Details   The patient was counseled about the risks, benefits, complications of the cesarean section. The patient concurred with the proposed plan, giving informed consent.  The site of surgery properly noted/marked. The patient was taken to Operating Room # 9, identified as Patric Christakos and the procedure verified as C-Section Delivery. A Time Out was held and the above information confirmed.  After spinal anesthesia was found to be adequate, the patient was placed in the dorsal supine position with a leftward tilt, the fetal heart beat was noted to be good in the 140's. The abdomen was prepped in usual fashion and draped. A Pfannenstiel incision was made and carried down through the subcutaneous tissue to the fascia.  The fascia was incised in the midline and the fascial incision was extended laterally using Mayo scissors.Kocher clamps were placed anteriorly on the fascial edge and it was dissected off the underlying rectus muscle.  The same was done inferiorly. The rectus was separated in the midline and the peritoneum entered bluntly with surgeon fingers. The peritoneum opening was extended manually with surgeon hands.  The Alexis retractor was then deployed. The vesicouterine peritoneum was identified, tented up, entered sharply with Metzenbaum scissors, and the bladder flap was created digitally. Scalpel was then used to make a low transverse incision on the uterus which was extended laterally with  blunt dissection. The fetal vertex was identified underneath the anterior placenta and a Mity Vac vacuum was placed  on the occiput. The assisting nurse pumped up the vacuum and then the head and body were delivered.  A live female infant was bulb suctioned on the operative field cried vigorously, cord was clamped and cut and the infant was passed to the waiting neonatologist. Apgars 9/9. Placenta was then delivered manually intact and did not appear normal it was jagged and there was a possible area of abruption. The uterus was cleared of all clot and debris. The uterine incision was repaired with #0 Monocryl in running locked fashion. A second imbricating suture was performed using the same suture. The incision was hemostatic. Ovaries and tubes were inspected and normal. The Alexis retractor was removed. The abdominal cavity was cleared of all clot and debris. The abdominal peritoneum was reapproximated with 2-0 chromic  in a running fashion, the rectus muscles was reapproximated with #2 chromic in running fashion. The fascia was closed with 0 Vicryl in a running fashion. The subcuticular layer was irrigated and all bleeders cauterized.  30 mL of  of 0.25% Marcaine was injected into the subcutaneous layer.  The Scarpas fascia was re-approximated with interrupted sutures of 2-0 plain.  The skin was closed with 4-0 vicryl in a subcuticular fashion using a Lanny Hurst needle. The incision was dressed with benzoine, steri strips and pressure dressing. All sponge lap and needle counts were correct x3.   Instrument, sponge, and needle counts were correct prior the abdominal closure and at the conclusion of the case.   Findings: Live female infant, Apgars 9/9, clear amniotic fluid, abnormal appearing placenta, normal uterus, bilateral tubes and ovaries  Estimated Blood Loss: 650 mL  IVF:     1400 mL LR  Drains: Foley catheter; 200 mL  clear urine         Specimens: Placenta to Pathology         Implants: none         Complications:  None; patient tolerated the procedure well.         Disposition: PACU - hemodynamically  stable.   Syeda Prickett STACIA

## 2016-05-14 ENCOUNTER — Inpatient Hospital Stay (HOSPITAL_COMMUNITY)
Admission: AD | Admit: 2016-05-14 | Discharge: 2016-05-14 | Disposition: A | Discharge status: Home / Self Care | Payer: BLUE CROSS/BLUE SHIELD | Source: Ambulatory Visit | Attending: Obstetrics and Gynecology | Admitting: Obstetrics and Gynecology

## 2016-05-14 DIAGNOSIS — O4410 Placenta previa with hemorrhage, unspecified trimester: Secondary | ICD-10-CM | POA: Insufficient documentation

## 2016-05-14 DIAGNOSIS — Z3A00 Weeks of gestation of pregnancy not specified: Secondary | ICD-10-CM | POA: Diagnosis not present

## 2016-05-14 MED ORDER — BETAMETHASONE SOD PHOS & ACET 6 (3-3) MG/ML IJ SUSP
12.0000 mg | Freq: Once | INTRAMUSCULAR | Status: AC
  Administered 2016-05-14: 12 mg via INTRAMUSCULAR
  Filled 2016-05-14: qty 2

## 2016-05-14 NOTE — MAU Note (Signed)
Had prior series.  "this is  A rescue dose".

## 2016-05-18 ENCOUNTER — Other Ambulatory Visit: Payer: Self-pay | Admitting: Obstetrics & Gynecology

## 2016-05-18 ENCOUNTER — Encounter (HOSPITAL_COMMUNITY)
Admission: RE | Admit: 2016-05-18 | Discharge: 2016-05-18 | Disposition: A | Discharge status: Home / Self Care | Payer: BLUE CROSS/BLUE SHIELD | Source: Ambulatory Visit | Attending: Obstetrics & Gynecology | Admitting: Obstetrics & Gynecology

## 2016-05-18 HISTORY — DX: Complete placenta previa nos or without hemorrhage, unspecified trimester: O44.00

## 2016-05-18 LAB — CBC
HEMATOCRIT: 30.2 % — AB (ref 36.0–46.0)
HEMOGLOBIN: 10.3 g/dL — AB (ref 12.0–15.0)
MCH: 29.7 pg (ref 26.0–34.0)
MCHC: 34.1 g/dL (ref 30.0–36.0)
MCV: 87 fL (ref 78.0–100.0)
PLATELETS: 244 10*3/uL (ref 150–400)
RBC: 3.47 MIL/uL — AB (ref 3.87–5.11)
RDW: 13.5 % (ref 11.5–15.5)
WBC: 10.6 10*3/uL — AB (ref 4.0–10.5)

## 2016-05-18 NOTE — Patient Instructions (Signed)
[place]  05/18/2016   Your procedure is scheduled on:  05/19/2016  Enter through the Main Entrance of [facility] [place] up the phone at the desk and dial [phone].   Call this number if you have problems the morning of surgery: [phone]   Remember:   Do not eat food:After Midnight.  Do not drink clear liquids: After Midnight.  Take these medicines the morning of surgery with A SIP OF WATER: may take zantac and zyrtec if you usually take them in the morning   Do not wear jewelry, make-up or nail polish.  Do not wear lotions, powders, or perfumes. Do not wear deodorant.  Do not shave 48 hours prior to surgery.  Do not bring valuables to the hospital.  [facility] is not   responsible for any belongings or valuables brought to the hospital.  Contacts, dentures or bridgework may not be worn into surgery.  Leave suitcase in the car. After surgery it may be brought to your room.  For patients admitted to the hospital, checkout time is 11:00 AM the day of              discharge.   Patients discharged the day of surgery will not be allowed to drive             home.  Name and phone number of your driver:na  Special Instructions:   N/A   Please read over the following fact sheets that you were given:   Surgical Site Infection Prevention

## 2016-05-19 ENCOUNTER — Inpatient Hospital Stay (HOSPITAL_COMMUNITY): Payer: BLUE CROSS/BLUE SHIELD | Admitting: Anesthesiology

## 2016-05-19 ENCOUNTER — Inpatient Hospital Stay (HOSPITAL_COMMUNITY)
Admission: RE | Admit: 2016-05-19 | Discharge: 2016-05-22 | DRG: 766 | Disposition: A | Discharge status: Home / Self Care | Payer: BLUE CROSS/BLUE SHIELD | Source: Ambulatory Visit | Attending: Obstetrics & Gynecology | Admitting: Obstetrics & Gynecology

## 2016-05-19 ENCOUNTER — Encounter (HOSPITAL_COMMUNITY): Payer: Self-pay | Admitting: *Deleted

## 2016-05-19 ENCOUNTER — Encounter (HOSPITAL_COMMUNITY)
Admission: RE | Disposition: A | Discharge status: Home / Self Care | Payer: Self-pay | Source: Ambulatory Visit | Attending: Obstetrics & Gynecology

## 2016-05-19 DIAGNOSIS — Z3A36 36 weeks gestation of pregnancy: Secondary | ICD-10-CM | POA: Diagnosis not present

## 2016-05-19 DIAGNOSIS — O4413 Placenta previa with hemorrhage, third trimester: Secondary | ICD-10-CM | POA: Diagnosis present

## 2016-05-19 DIAGNOSIS — Z8249 Family history of ischemic heart disease and other diseases of the circulatory system: Secondary | ICD-10-CM

## 2016-05-19 DIAGNOSIS — O99824 Streptococcus B carrier state complicating childbirth: Secondary | ICD-10-CM | POA: Diagnosis present

## 2016-05-19 LAB — PREPARE RBC (CROSSMATCH)

## 2016-05-19 LAB — RPR: RPR: NONREACTIVE

## 2016-05-19 SURGERY — Surgical Case
Anesthesia: Spinal

## 2016-05-19 MED ORDER — NALBUPHINE HCL 10 MG/ML IJ SOLN: 5.0000 mg | INTRAMUSCULAR | Status: DC | PRN

## 2016-05-19 MED ORDER — MENTHOL 3 MG MT LOZG: 1.0000 | LOZENGE | OROMUCOSAL | Status: DC | PRN

## 2016-05-19 MED ORDER — PHENYLEPHRINE 8 MG IN D5W 100 ML (0.08MG/ML) PREMIX OPTIME
INJECTION | INTRAVENOUS | Status: AC
  Filled 2016-05-19: qty 100

## 2016-05-19 MED ORDER — DIPHENHYDRAMINE HCL 25 MG PO CAPS
25.0000 mg | ORAL_CAPSULE | Freq: Four times a day (QID) | ORAL | Status: DC | PRN
  Filled 2016-05-19: qty 1

## 2016-05-19 MED ORDER — SIMETHICONE 80 MG PO CHEW
80.0000 mg | CHEWABLE_TABLET | Freq: Three times a day (TID) | ORAL | Status: DC
  Administered 2016-05-19 – 2016-05-22 (×9): 80 mg via ORAL
  Filled 2016-05-19 (×15): qty 1

## 2016-05-19 MED ORDER — NALOXONE HCL 0.4 MG/ML IJ SOLN: 0.4000 mg | INTRAMUSCULAR | Status: DC | PRN

## 2016-05-19 MED ORDER — ONDANSETRON HCL 4 MG/2ML IJ SOLN: 4.0000 mg | Freq: Four times a day (QID) | INTRAMUSCULAR | Status: DC | PRN

## 2016-05-19 MED ORDER — MORPHINE SULFATE (PF) 0.5 MG/ML IJ SOLN
INTRAMUSCULAR | Status: AC
  Filled 2016-05-19: qty 10

## 2016-05-19 MED ORDER — SIMETHICONE 80 MG PO CHEW
80.0000 mg | CHEWABLE_TABLET | ORAL | Status: DC
  Administered 2016-05-19 – 2016-05-22 (×3): 80 mg via ORAL
  Filled 2016-05-19 (×3): qty 1

## 2016-05-19 MED ORDER — OXYTOCIN 10 UNIT/ML IJ SOLN
INTRAVENOUS | Status: DC | PRN
  Administered 2016-05-19: 40 [IU] via INTRAVENOUS

## 2016-05-19 MED ORDER — FENTANYL CITRATE (PF) 100 MCG/2ML IJ SOLN
INTRAMUSCULAR | Status: AC
  Filled 2016-05-19: qty 2

## 2016-05-19 MED ORDER — LACTATED RINGERS IV SOLN
INTRAVENOUS | Status: DC | PRN
  Administered 2016-05-19: 09:00:00 via INTRAVENOUS

## 2016-05-19 MED ORDER — SODIUM CHLORIDE 0.9% FLUSH: 3.0000 mL | INTRAVENOUS | Status: DC | PRN

## 2016-05-19 MED ORDER — PROMETHAZINE HCL 25 MG/ML IJ SOLN: 6.2500 mg | INTRAMUSCULAR | Status: DC | PRN

## 2016-05-19 MED ORDER — FENTANYL CITRATE (PF) 100 MCG/2ML IJ SOLN
INTRAMUSCULAR | Status: DC | PRN
  Administered 2016-05-19: 10 ug via INTRATHECAL

## 2016-05-19 MED ORDER — PRENATAL MULTIVITAMIN CH
1.0000 | ORAL_TABLET | Freq: Every day | ORAL | Status: DC
  Administered 2016-05-20 – 2016-05-22 (×3): 1 via ORAL
  Filled 2016-05-19 (×3): qty 1

## 2016-05-19 MED ORDER — ACETAMINOPHEN 500 MG PO TABS
1000.0000 mg | ORAL_TABLET | Freq: Four times a day (QID) | ORAL | Status: AC
  Administered 2016-05-19 – 2016-05-20 (×4): 1000 mg via ORAL
  Filled 2016-05-19 (×4): qty 2

## 2016-05-19 MED ORDER — NALBUPHINE HCL 10 MG/ML IJ SOLN: 5.0000 mg | Freq: Once | INTRAMUSCULAR | Status: DC | PRN

## 2016-05-19 MED ORDER — SENNOSIDES-DOCUSATE SODIUM 8.6-50 MG PO TABS
2.0000 | ORAL_TABLET | ORAL | Status: DC
  Administered 2016-05-19 – 2016-05-22 (×3): 2 via ORAL
  Filled 2016-05-19 (×5): qty 2

## 2016-05-19 MED ORDER — BUPIVACAINE HCL (PF) 0.25 % IJ SOLN
INTRAMUSCULAR | Status: DC | PRN
  Administered 2016-05-19 (×2): 30 mL

## 2016-05-19 MED ORDER — DIPHENHYDRAMINE HCL 50 MG/ML IJ SOLN: 12.5000 mg | INTRAMUSCULAR | Status: DC | PRN

## 2016-05-19 MED ORDER — KETOROLAC TROMETHAMINE 30 MG/ML IJ SOLN: 30.0000 mg | Freq: Four times a day (QID) | INTRAMUSCULAR | Status: AC | PRN

## 2016-05-19 MED ORDER — NALOXONE HCL 2 MG/2ML IJ SOSY
1.0000 ug/kg/h | PREFILLED_SYRINGE | INTRAMUSCULAR | Status: DC | PRN
  Filled 2016-05-19: qty 2

## 2016-05-19 MED ORDER — ZOLPIDEM TARTRATE 5 MG PO TABS: 5.0000 mg | ORAL_TABLET | Freq: Every evening | ORAL | Status: DC | PRN

## 2016-05-19 MED ORDER — SODIUM CHLORIDE 0.9 % IR SOLN
Status: DC | PRN
  Administered 2016-05-19: 1000 mL

## 2016-05-19 MED ORDER — OXYCODONE-ACETAMINOPHEN 5-325 MG PO TABS
1.0000 | ORAL_TABLET | ORAL | Status: DC | PRN
  Administered 2016-05-20: 1 via ORAL
  Filled 2016-05-19: qty 1

## 2016-05-19 MED ORDER — PHENYLEPHRINE 8 MG IN D5W 100 ML (0.08MG/ML) PREMIX OPTIME
INJECTION | INTRAVENOUS | Status: DC | PRN
  Administered 2016-05-19: 60 ug/min via INTRAVENOUS

## 2016-05-19 MED ORDER — LACTATED RINGERS IV SOLN
INTRAVENOUS | Status: DC
  Administered 2016-05-19: 125 mL/h via INTRAVENOUS

## 2016-05-19 MED ORDER — OXYTOCIN 40 UNITS IN LACTATED RINGERS INFUSION - SIMPLE MED: 2.5000 [IU]/h | INTRAVENOUS | Status: AC

## 2016-05-19 MED ORDER — ONDANSETRON HCL 4 MG/2ML IJ SOLN
INTRAMUSCULAR | Status: AC
  Filled 2016-05-19: qty 2

## 2016-05-19 MED ORDER — FENTANYL CITRATE (PF) 100 MCG/2ML IJ SOLN: 25.0000 ug | INTRAMUSCULAR | Status: DC | PRN

## 2016-05-19 MED ORDER — NALBUPHINE HCL 10 MG/ML IJ SOLN
5.0000 mg | INTRAMUSCULAR | Status: DC | PRN
  Administered 2016-05-19 – 2016-05-20 (×5): 5 mg via SUBCUTANEOUS
  Filled 2016-05-19 (×5): qty 1

## 2016-05-19 MED ORDER — OXYTOCIN 10 UNIT/ML IJ SOLN
INTRAMUSCULAR | Status: AC
  Filled 2016-05-19: qty 4

## 2016-05-19 MED ORDER — OXYCODONE-ACETAMINOPHEN 5-325 MG PO TABS
2.0000 | ORAL_TABLET | ORAL | Status: DC | PRN
  Administered 2016-05-20 – 2016-05-22 (×7): 2 via ORAL
  Filled 2016-05-19 (×7): qty 2

## 2016-05-19 MED ORDER — IBUPROFEN 600 MG PO TABS
600.0000 mg | ORAL_TABLET | Freq: Four times a day (QID) | ORAL | Status: DC
  Administered 2016-05-19 – 2016-05-22 (×12): 600 mg via ORAL
  Filled 2016-05-19 (×12): qty 1

## 2016-05-19 MED ORDER — SOD CITRATE-CITRIC ACID 500-334 MG/5ML PO SOLN
30.0000 mL | Freq: Once | ORAL | Status: AC
  Administered 2016-05-19: 30 mL via ORAL
  Filled 2016-05-19: qty 15

## 2016-05-19 MED ORDER — COCONUT OIL OIL
1.0000 "application " | TOPICAL_OIL | Status: DC | PRN
  Administered 2016-05-20: 1 via TOPICAL
  Filled 2016-05-19: qty 120

## 2016-05-19 MED ORDER — ONDANSETRON HCL 4 MG/2ML IJ SOLN
INTRAMUSCULAR | Status: DC | PRN
  Administered 2016-05-19: 4 mg via INTRAVENOUS

## 2016-05-19 MED ORDER — MORPHINE SULFATE (PF) 0.5 MG/ML IJ SOLN
INTRAMUSCULAR | Status: DC | PRN
  Administered 2016-05-19: .2 mg via EPIDURAL

## 2016-05-19 MED ORDER — WITCH HAZEL-GLYCERIN EX PADS: 1.0000 "application " | MEDICATED_PAD | CUTANEOUS | Status: DC | PRN

## 2016-05-19 MED ORDER — SCOPOLAMINE 1 MG/3DAYS TD PT72
1.0000 | MEDICATED_PATCH | TRANSDERMAL | Status: DC
  Administered 2016-05-19: 1.5 mg via TRANSDERMAL
  Filled 2016-05-19 (×2): qty 1

## 2016-05-19 MED ORDER — DIBUCAINE 1 % RE OINT: 1.0000 "application " | TOPICAL_OINTMENT | RECTAL | Status: DC | PRN

## 2016-05-19 MED ORDER — BUPIVACAINE IN DEXTROSE 0.75-8.25 % IT SOLN
INTRATHECAL | Status: DC | PRN
  Administered 2016-05-19: 1.6 mL via INTRATHECAL

## 2016-05-19 MED ORDER — CEFAZOLIN SODIUM-DEXTROSE 2-4 GM/100ML-% IV SOLN
2.0000 g | INTRAVENOUS | Status: AC
  Administered 2016-05-19: 2 g via INTRAVENOUS
  Filled 2016-05-19: qty 100

## 2016-05-19 MED ORDER — KETOROLAC TROMETHAMINE 30 MG/ML IJ SOLN
30.0000 mg | Freq: Four times a day (QID) | INTRAMUSCULAR | Status: AC | PRN
  Administered 2016-05-19: 30 mg via INTRAVENOUS
  Filled 2016-05-19: qty 1

## 2016-05-19 MED ORDER — ONDANSETRON HCL 4 MG/2ML IJ SOLN: 4.0000 mg | Freq: Three times a day (TID) | INTRAMUSCULAR | Status: DC | PRN

## 2016-05-19 MED ORDER — MEPERIDINE HCL 25 MG/ML IJ SOLN: 6.2500 mg | INTRAMUSCULAR | Status: DC | PRN

## 2016-05-19 MED ORDER — LACTATED RINGERS IV SOLN
INTRAVENOUS | Status: DC
  Administered 2016-05-19: 08:00:00 via INTRAVENOUS

## 2016-05-19 MED ORDER — BUPIVACAINE HCL (PF) 0.25 % IJ SOLN
INTRAMUSCULAR | Status: AC
  Filled 2016-05-19: qty 30

## 2016-05-19 MED ORDER — TETANUS-DIPHTH-ACELL PERTUSSIS 5-2.5-18.5 LF-MCG/0.5 IM SUSP: 0.5000 mL | Freq: Once | INTRAMUSCULAR | Status: DC

## 2016-05-19 MED ORDER — SIMETHICONE 80 MG PO CHEW: 80.0000 mg | CHEWABLE_TABLET | ORAL | Status: DC | PRN

## 2016-05-19 MED ORDER — DIPHENHYDRAMINE HCL 25 MG PO CAPS
25.0000 mg | ORAL_CAPSULE | ORAL | Status: DC | PRN
  Filled 2016-05-19: qty 1

## 2016-05-19 SURGICAL SUPPLY — 39 items
BENZOIN TINCTURE PRP APPL 2/3 (GAUZE/BANDAGES/DRESSINGS) ×3 IMPLANT
CHLORAPREP W/TINT 26ML (MISCELLANEOUS) ×3 IMPLANT
CLAMP CORD UMBIL (MISCELLANEOUS) IMPLANT
CLOSURE STERI-STRIP 1/2X4 (GAUZE/BANDAGES/DRESSINGS) ×1
CLOSURE WOUND 1/2 X4 (GAUZE/BANDAGES/DRESSINGS) ×1
CLOTH BEACON ORANGE TIMEOUT ST (SAFETY) ×3 IMPLANT
CLSR STERI-STRIP ANTIMIC 1/2X4 (GAUZE/BANDAGES/DRESSINGS) ×2 IMPLANT
DRSG OPSITE POSTOP 4X10 (GAUZE/BANDAGES/DRESSINGS) ×3 IMPLANT
ELECT REM PT RETURN 9FT ADLT (ELECTROSURGICAL) ×3
ELECTRODE REM PT RTRN 9FT ADLT (ELECTROSURGICAL) ×1 IMPLANT
EXTRACTOR VACUUM BELL STYLE (SUCTIONS) IMPLANT
EXTRACTOR VACUUM KIWI (MISCELLANEOUS) IMPLANT
GLOVE BIO SURGEON STRL SZ 6.5 (GLOVE) ×2 IMPLANT
GLOVE BIO SURGEONS STRL SZ 6.5 (GLOVE) ×1
GLOVE BIOGEL PI IND STRL 7.0 (GLOVE) ×2 IMPLANT
GLOVE BIOGEL PI INDICATOR 7.0 (GLOVE) ×4
GOWN STRL REUS W/TWL LRG LVL3 (GOWN DISPOSABLE) ×9 IMPLANT
KIT ABG SYR 3ML LUER SLIP (SYRINGE) IMPLANT
NEEDLE HYPO 22GX1.5 SAFETY (NEEDLE) IMPLANT
NEEDLE HYPO 25X5/8 SAFETYGLIDE (NEEDLE) IMPLANT
NS IRRIG 1000ML POUR BTL (IV SOLUTION) ×3 IMPLANT
PACK C SECTION WH (CUSTOM PROCEDURE TRAY) ×3 IMPLANT
PAD ABD 7.5X8 STRL (GAUZE/BANDAGES/DRESSINGS) ×3 IMPLANT
PAD OB MATERNITY 4.3X12.25 (PERSONAL CARE ITEMS) ×3 IMPLANT
PENCIL SMOKE EVAC W/HOLSTER (ELECTROSURGICAL) ×3 IMPLANT
RTRCTR C-SECT PINK 25CM LRG (MISCELLANEOUS) ×3 IMPLANT
STRIP CLOSURE SKIN 1/2X4 (GAUZE/BANDAGES/DRESSINGS) ×2 IMPLANT
SUT CHROMIC 2 0 CT 1 (SUTURE) ×6 IMPLANT
SUT MNCRL 0 VIOLET CTX 36 (SUTURE) ×2 IMPLANT
SUT MONOCRYL 0 CTX 36 (SUTURE) ×4
SUT PDS AB 0 CTX 36 PDP370T (SUTURE) IMPLANT
SUT PLAIN 2 0 (SUTURE) ×2
SUT PLAIN ABS 2-0 CT1 27XMFL (SUTURE) ×1 IMPLANT
SUT VIC AB 0 CTX 36 (SUTURE) ×4
SUT VIC AB 0 CTX36XBRD ANBCTRL (SUTURE) ×2 IMPLANT
SUT VIC AB 4-0 KS 27 (SUTURE) ×3 IMPLANT
SYR CONTROL 10ML LL (SYRINGE) IMPLANT
TOWEL OR 17X24 6PK STRL BLUE (TOWEL DISPOSABLE) ×3 IMPLANT
TRAY FOLEY CATH SILVER 14FR (SET/KITS/TRAYS/PACK) ×3 IMPLANT

## 2016-05-19 NOTE — Transfer of Care (Signed)
Immediate Anesthesia Transfer of Care Note  Patient: Sarah Berry  Procedure(s) Performed: Procedure(s): CESAREAN SECTION (N/A)  Patient Location: PACU  Anesthesia Type:Spinal  Level of Consciousness: awake, alert , oriented and patient cooperative  Airway & Oxygen Therapy: Patient Spontanous Breathing  Post-op Assessment: Report given to RN and Post -op Vital signs reviewed and stable  Post vital signs: Reviewed and stable  Last Vitals:  Vitals:   05/19/16 0750  BP: 133/90  Pulse: (!) 108  Resp: 18  Temp: 36.7 C    Last Pain:  Vitals:   05/19/16 0750  TempSrc: Oral  PainSc:          Complications: No apparent anesthesia complications

## 2016-05-19 NOTE — Anesthesia Postprocedure Evaluation (Signed)
Anesthesia Post Note  Patient: Sarah Berry  Procedure(s) Performed: Procedure(s) (LRB): CESAREAN SECTION (N/A)  Patient location during evaluation: Mother Baby Anesthesia Type: Spinal Level of consciousness: awake and alert and oriented Pain management: satisfactory to patient Vital Signs Assessment: post-procedure vital signs reviewed and stable Respiratory status: spontaneous breathing and nonlabored ventilation Cardiovascular status: stable Postop Assessment: no headache, no backache, no signs of nausea or vomiting, adequate PO intake and patient able to bend at knees (patient up walking) Anesthetic complications: no        Last Vitals:  Vitals:   05/19/16 1440 05/19/16 1540  BP: 124/80 (!) 98/58  Pulse: 87 90  Resp: 19 18  Temp: 36.6 C 36.7 C    Last Pain:  Vitals:   05/19/16 1540  TempSrc: Oral  PainSc:    Pain Goal: Patients Stated Pain Goal: 3 (05/19/16 1340)               Bird Tailor

## 2016-05-19 NOTE — Lactation Note (Signed)
This note was copied from a baby's chart. Lactation Consultation Note Follow up visit at 10 hours of age.  Baby just finished feeding at the breast for a few minutes and now STS.  Mom is discouraged because baby is not doing as well as she was earlier.  Mom is also concerned she will never produce milk LC discussed LPI policy after RN already discussed with parents.  LC advised most important to get baby calories and we will continue to work on latching as long as needed.  LC explained to mom baby may do better closer to due date and we have o/p services available. LC validated moms concerns about milk supply, explained normal progress of milk volume and encouraged mom to keep pumping even if she doesn't see expressed milk at this time.  Plan:  Mom to wake baby every 2 1/2 hours for feedings.  Work on hand expression to start a few drops.  Latch baby with stimulation to keep baby awake.  Limit feedings to time baby is doing well and then allow STS. Mom or FOB to supplement by syringe/finger feeding after all feedings.  Parents to advise RN if baby is not doing well with a feeding. Parents aware to limit attempt latching and supplement to a total of 30 minutes to not exhaust baby.  [place] assisted with flange fitting with #27 on right breast and #24 on left.  Mom reports some burning with pumping.  Assisted with positioning and advised RN may get mom coconut oil to use with pumping as needed.  Nipple is intact and pink.   LC assisted with hand expression bilaterally and no drops noted at this time.      Patient Name: Sarah Berry [id number] Date: 05/19/2016 Reason for consult: Follow-up assessment;Infant < 6lbs;Late preterm infant   Maternal Data Has patient been taught Hand Expression?: Yes  Feeding Feeding Type: Breast Fed Length of feed: 4 min (sustained latch.)  LATCH Score/Interventions                      Lactation Tools Discussed/Used Pump Review: Setup, frequency, and  cleaning;Milk Storage Initiated by:: JS Date initiated:: 05/19/16   Consult Status Consult Status: Follow-up Date: 05/20/16 Follow-up type: In-patient    Daud Cayer, Justine Null 05/19/2016, 7:15 PM

## 2016-05-19 NOTE — Lactation Note (Addendum)
This note was copied from a baby's chart. Lactation Consultation Note  Patient Name: Girl Cleatis Hoffert [id number] Date: 05/19/2016 Reason for consult: Initial assessment Baby at 2 hr of life. Before lactation arrival, baby bf 10 minutes on the L side. While talking to parents, baby started to cue. Assisted mom with cradle/laid back bf in recovery room. Mom has compressible breast with everted nipples. She denies breast or nipple pain. She is concerned about supply and baby's ability to move milk. Baby did some off/on but overall was able maintain short bursts of sucking. With visual assessment, baby has a nice gape, flanged lips, and good lateralization of tongue. Mom's arms are shaky, her legs are still numb, and she is sleepy. We did NOT go over LPT policy. She will need DEBP set up. Report given to SK (lactation on [place] today). Discussed baby behavior, feeding frequency, supplementing, baby belly size, voids, wt loss, breast changes, and nipple care. Demonstrated manual expression, scant colostrum noted bilaterally. Given lactation handouts. Aware of OP services and support group.     Maternal Data Has patient been taught Hand Expression?: Yes  Feeding Feeding Type: Breast Fed Length of feed: 10 min  LATCH Score/Interventions Latch: Repeated attempts needed to sustain latch, nipple held in mouth throughout feeding, stimulation needed to elicit sucking reflex. Intervention(s): Breast compression;Assist with latch  Audible Swallowing: A few with stimulation Intervention(s): Skin to skin;Hand expression;Alternate breast massage  Type of Nipple: Everted at rest and after stimulation  Comfort (Breast/Nipple): Soft / non-tender     Hold (Positioning): Full assist, staff holds infant at breast Intervention(s): Position options;Support Pillows  LATCH Score: 6  Lactation Tools Discussed/Used     Consult Status Consult Status: Follow-up Date: 05/19/16 Follow-up type:  In-patient    Denzil Hughes 05/19/2016, 11:08 AM

## 2016-05-19 NOTE — Op Note (Signed)
Cesarean Section Procedure Note  Indications: 36 week 1 day intrauterine pregnancy with complete placenta previa and bleeding throughout the pregnancy.  Pre-operative Diagnosis: complete placenta previa; second and third trimester bleeding  Post-operative Diagnosis: same  Surgeon: Caffie Damme   Assistants: Vanessa Kick  Anesthesia: Spinal anesthesia  ASA Class: 2  Procedure Details   The patient was counseled about the risks, benefits, complications of the cesarean section. The patient concurred with the proposed plan, giving informed consent.  The site of surgery properly noted/marked. The patient was taken to Operating Room # 9, identified as Sarah Berry and the procedure verified as C-Section Delivery. A Time Out was held and the above information confirmed.  After spinal anesthesia was found to be adequate, the patient was placed in the dorsal supine position with a leftward tilt, the fetal heart beat was noted to be good in the 140's. The abdomen was prepped in usual fashion and draped. A Pfannenstiel incision was made and carried down through the subcutaneous tissue to the fascia.  The fascia was incised in the midline and the fascial incision was extended laterally using Mayo scissors.Kocher clamps were placed anteriorly on the fascial edge and it was dissected off the underlying rectus muscle.  The same was done inferiorly. The rectus was separated in the midline and the peritoneum entered bluntly with surgeon fingers. The peritoneum opening was extended manually with surgeon hands.  The Alexis retractor was then deployed. The vesicouterine peritoneum was identified, tented up, entered sharply with Metzenbaum scissors, and the bladder flap was created digitally. Scalpel was then used to make a low transverse incision on the uterus which was extended laterally with  blunt dissection. The fetal vertex was identified underneath the anterior placenta and a Mity Vac vacuum was placed  on the occiput. The assisting nurse pumped up the vacuum and then the head and body were delivered.  A live female infant was bulb suctioned on the operative field cried vigorously, cord was clamped and cut and the infant was passed to the waiting neonatologist. Apgars 9/9. Placenta was then delivered manually intact and did not appear normal it was jagged and there was a possible area of abruption. The uterus was cleared of all clot and debris. The uterine incision was repaired with #0 Monocryl in running locked fashion. A second imbricating suture was performed using the same suture. The incision was hemostatic. Ovaries and tubes were inspected and normal. The Alexis retractor was removed. The abdominal cavity was cleared of all clot and debris. The abdominal peritoneum was reapproximated with 2-0 chromic  in a running fashion, the rectus muscles was reapproximated with #2 chromic in running fashion. The fascia was closed with 0 Vicryl in a running fashion. The subcuticular layer was irrigated and all bleeders cauterized.  30 mL of  of 0.25% Marcaine was injected into the subcutaneous layer.  The Scarpas fascia was re-approximated with interrupted sutures of 2-0 plain.  The skin was closed with 4-0 vicryl in a subcuticular fashion using a Lanny Hurst needle. The incision was dressed with benzoine, steri strips and pressure dressing. All sponge lap and needle counts were correct x3.   Instrument, sponge, and needle counts were correct prior the abdominal closure and at the conclusion of the case.   Findings: Live female infant, Apgars 9/9, clear amniotic fluid, abnormal appearing placenta, normal uterus, bilateral tubes and ovaries  Estimated Blood Loss: 650 mL  IVF:     1400 mL LR  Drains: Foley catheter; 200 mL  clear urine         Specimens: Placenta to Pathology         Implants: none         Complications:  None; patient tolerated the procedure well.         Disposition: PACU - hemodynamically  stable.   Kiptyn Rafuse STACIA

## 2016-05-19 NOTE — Anesthesia Postprocedure Evaluation (Signed)
Anesthesia Post Note  Patient: Sarah Berry  Procedure(s) Performed: Procedure(s) (LRB): CESAREAN SECTION (N/A)  Patient location during evaluation: PACU Anesthesia Type: Spinal Level of consciousness: oriented and awake and alert Pain management: pain level controlled Vital Signs Assessment: post-procedure vital signs reviewed and stable Respiratory status: spontaneous breathing, respiratory function stable and patient connected to nasal cannula oxygen Cardiovascular status: blood pressure returned to baseline and stable Postop Assessment: no headache, no backache, spinal receding, patient able to bend at knees and no signs of nausea or vomiting Anesthetic complications: no        Last Vitals:  Vitals:   05/19/16 1108 05/19/16 1109  BP:    Pulse: 81 80  Resp: 14 14  Temp:      Last Pain:  Vitals:   05/19/16 1115  TempSrc:   PainSc: 0-No pain   Pain Goal:                 Catalina Gravel

## 2016-05-19 NOTE — Addendum Note (Signed)
Addendum  created 05/19/16 1623 by Jonna Munro, CRNA   Charge Capture section accepted, Sign clinical note

## 2016-05-19 NOTE — H&P (Signed)
Sarah Berry is a 31 y.o. female presenting for scheduled primary cesarean delivery for complete placenta previa. Her pregnancy has been complicated with bleeding throughout the pregnancy and BPD/ HC lag. She has been co-managed with MFM  OB History    Gravida Para Term Preterm AB Living   1         0   SAB TAB Ectopic Multiple Live Births                 Past Medical History:  Diagnosis Date  . Fibroid   . Infertility, female, primary   . Newborn product of in vitro fertilization (IVF) pregnancy   . Placenta previa    Past Surgical History:  Procedure Laterality Date  . egg retrieval    . MYOMECTOMY    . WISDOM TOOTH EXTRACTION     Family History: family history includes Cancer in her paternal grandmother; Hypertension in her father and mother. Social History:  reports that she has never smoked. She has never used smokeless tobacco. She reports that she does not drink alcohol or use drugs.     Maternal Diabetes: No Genetic Screening: Normal Maternal Ultrasounds/Referrals: Normal Fetal Ultrasounds or other Referrals:  Referred to Materal Fetal Medicine , Other: previa Maternal Substance Abuse:  No Significant Maternal Medications:  None Significant Maternal Lab Results:  Lab values include: Group B Strep positive Other Comments:  None  Review of Systems  Constitutional: Negative.   HENT: Negative.   Eyes: Negative.   Cardiovascular: Negative.   Skin: Negative.   All other systems reviewed and are negative.  Maternal Medical History:  Reason for admission: Scheduled primary c-section  Contractions: Frequency: rare.    Fetal activity: Perceived fetal activity is normal.   Last perceived fetal movement was within the past 12 hours.    Prenatal complications: Bleeding.   Bleeding, complete previa  Prenatal Complications - Diabetes: none.      Blood pressure 133/90, pulse (!) 108, temperature 98.1 F (36.7 C), temperature source Oral, resp. rate 18, height 5'  7.5" (1.715 m), weight 88 kg (194 lb), last menstrual period 09/07/2015, SpO2 99 %. Maternal Exam:  Uterine Assessment: Contraction frequency is rare.   Abdomen: Patient reports no abdominal tenderness. Fundal height is 36cm.   Estimated fetal weight is 2500 grams.   Fetal presentation: vertex  Introitus: Normal vulva. Ferning test: not done.  Nitrazine test: not done. Amniotic fluid character: not assessed.     Fetal Exam Fetal Monitor Review: Baseline rate: 145.  Variability: moderate (6-25 bpm).   Pattern: accelerations present and no decelerations.       Physical Exam  Nursing note and vitals reviewed. Constitutional: She appears well-developed.  Cardiovascular: Normal rate.     Prenatal labs: ABO, Rh: --/--/O POS (01/09 1005) Antibody: NEG (01/09 1005) Rubella: Immune (07/25 0000) RPR: Non Reactive (01/09 1005)  HBsAg: Negative (07/25 0000)  HIV: Non-reactive (07/25 0000)  GBS:     Assessment/Plan: 31 yo G1P0 IVF pregnancy with complete placenta previa at 36 weeks 1 days On call to OR for scheduled primary cesarean delivery Patient counseled about risks of surgery and she agrees to proceed   [place], [place] 05/19/2016, 8:14 AM

## 2016-05-19 NOTE — Anesthesia Procedure Notes (Signed)
Spinal  Patient location during procedure: OR Start time: 05/19/2016 8:30 AM End time: 05/19/2016 8:33 AM Staffing Anesthesiologist: Catalina Gravel Performed: anesthesiologist  Preanesthetic Checklist Completed: patient identified, surgical consent, pre-op evaluation, timeout performed, IV checked, risks and benefits discussed and monitors and equipment checked Spinal Block Patient position: sitting Prep: site prepped and draped and DuraPrep Patient monitoring: continuous pulse ox and blood pressure Approach: midline Location: L3-4 Needle Needle type: Pencan  Needle gauge: 25 G Needle length: 9 cm Assessment Sensory level: T4 Additional Notes Functioning IV was confirmed and monitors were applied. Sterile prep and drape, including hand hygiene, mask and sterile gloves were used. The patient was positioned and the spine was prepped. The skin was anesthetized with lidocaine.  Free flow of clear CSF was obtained prior to injecting local anesthetic into the CSF.  The spinal needle aspirated freely following injection.  The needle was carefully withdrawn.  The patient tolerated the procedure well. Consent was obtained prior to procedure with all questions answered and concerns addressed. Risks including but not limited to bleeding, infection, nerve damage, paralysis, failed block, inadequate analgesia, allergic reaction, high spinal, itching and headache were discussed and the patient wished to proceed.   Hoy Morn, MD

## 2016-05-19 NOTE — Anesthesia Preprocedure Evaluation (Signed)
Anesthesia Evaluation  Patient identified by MRN, date of birth, ID band Patient awake    Reviewed: Allergy & Precautions, NPO status , Patient's Chart, lab work & pertinent test results  Airway Mallampati: II  TM Distance: >3 FB Neck ROM: Full    Dental  (+) Teeth Intact, Dental Advisory Given   Pulmonary neg pulmonary ROS,    Pulmonary exam normal breath sounds clear to auscultation       Cardiovascular Exercise Tolerance: Good negative cardio ROS Normal cardiovascular exam Rhythm:Regular Rate:Normal     Neuro/Psych negative neurological ROS     GI/Hepatic negative GI ROS, Neg liver ROS,   Endo/Other  negative endocrine ROS  Renal/GU negative Renal ROS     Musculoskeletal negative musculoskeletal ROS (+)   Abdominal   Peds  Hematology  (+) Blood dyscrasia, anemia , Plt 244k   Anesthesia Other Findings Day of surgery medications reviewed with the patient.  Reproductive/Obstetrics (+) Pregnancy Placenta previa                             Anesthesia Physical Anesthesia Plan  ASA: II  Anesthesia Plan: Spinal   Post-op Pain Management:    Induction:   Airway Management Planned:   Additional Equipment:   Intra-op Plan:   Post-operative Plan:   Informed Consent: I have reviewed the patients History and Physical, chart, labs and discussed the procedure including the risks, benefits and alternatives for the proposed anesthesia with the patient or authorized representative who has indicated his/her understanding and acceptance.   Dental advisory given  Plan Discussed with: CRNA, Anesthesiologist and Surgeon  Anesthesia Plan Comments: (Discussed risks and benefits of and differences between spinal and general. Discussed risks of spinal including headache, backache, failure, bleeding, infection, and nerve damage. Patient consents to spinal. Questions answered. Coagulation studies  and platelet count acceptable.)        Anesthesia Quick Evaluation

## 2016-05-20 LAB — CBC
HEMATOCRIT: 28.2 % — AB (ref 36.0–46.0)
HEMOGLOBIN: 9.5 g/dL — AB (ref 12.0–15.0)
MCH: 29.4 pg (ref 26.0–34.0)
MCHC: 33.7 g/dL (ref 30.0–36.0)
MCV: 87.3 fL (ref 78.0–100.0)
Platelets: 210 10*3/uL (ref 150–400)
RBC: 3.23 MIL/uL — AB (ref 3.87–5.11)
RDW: 13.6 % (ref 11.5–15.5)
WBC: 12.7 10*3/uL — ABNORMAL HIGH (ref 4.0–10.5)

## 2016-05-20 MED ORDER — OXYCODONE HCL 5 MG PO TABS
5.0000 mg | ORAL_TABLET | Freq: Once | ORAL | Status: AC
  Administered 2016-05-20: 5 mg via ORAL
  Filled 2016-05-20: qty 1

## 2016-05-20 NOTE — Lactation Note (Signed)
This note was copied from a baby's chart. Lactation Consultation Note: Mother paged for latch assistance. Several attempts to latch infant on in cross cradle hold and football hold. Infant latched on and off with a few sucks. No sustained latch. Mother was fit with #20 nipple shield. No latch achieved. Mother has only a tiny amt of colostrum when hand expressed. Father fed infant formula with a curved tip syringe and finger.  Mother pumping with #24 flanges. Advised mother to continue to cue base feed . Advised mother to firm nipple piror to applying nipple shield if needed. Advised to increase amts of supplement if infant continues to latch poorly.   Patient Name: [name] [id number] Date: 05/20/2016 Reason for consult: Follow-up assessment   Maternal Data    Feeding Feeding Type: Formula  LATCH Score/Interventions Latch: Repeated attempts needed to sustain latch, nipple held in mouth throughout feeding, stimulation needed to elicit sucking reflex. Intervention(s): Adjust position;Assist with latch;Breast compression  Audible Swallowing: None Intervention(s): Skin to skin Intervention(s): Hand expression;Alternate breast massage  Type of Nipple: Flat Intervention(s): Double electric pump  Comfort (Breast/Nipple): Soft / non-tender     Hold (Positioning): Assistance needed to correctly position infant at breast and maintain latch. Intervention(s): Support Pillows;Position options  LATCH Score: 5  Lactation Tools Discussed/Used Tools: Nipple Shields (no latch achieved with shield) Nipple shield size: 20   Consult Status Consult Status: Follow-up Date: 05/20/16 Follow-up type: In-patient    Jess Barters [facility] 05/20/2016, 11:30 AM

## 2016-05-20 NOTE — Progress Notes (Signed)
Subjective: Postpartum Day 1: Cesarean Delivery Patient reports pain controlled, no nausea or vomiting. Pain well controlled.  Objective: Vital signs in last 24 hours: Temp:  [97.7 F (36.5 C)-98.9 F (37.2 C)] 98 F (36.7 C) (01/11 1757) Pulse Rate:  [79-94] 93 (01/11 1757) Resp:  [14-20] 18 (01/11 1757) BP: (115-133)/(60-83) 133/83 (01/11 1757) SpO2:  [95 %-99 %] 99 % (01/11 0859)  Physical Exam:  General: alert, cooperative and appears stated age Lochia: appropriate Uterine Fundus: firm Incision: dressing C/D/I DVT Evaluation: No evidence of DVT seen on physical exam.   Recent Labs  05/18/16 1005 05/20/16 0516  HGB 10.3* 9.5*  HCT 30.2* 28.2*    Assessment/Plan: Status post Cesarean section. Doing well postoperatively.  Continue current care.  Kylon Philbrook H. 05/20/2016, 7:25 PM

## 2016-05-20 NOTE — Lactation Note (Addendum)
This note was copied from a baby's chart. Lactation Consultation Note Monitored to make sure mom was supplementing baby on flowsheets. Needed to see latch. Sleeping when checked on mom during the night.  Patient Name: Sarah Berry [id number] Date: 05/20/2016     Maternal Data    Feeding Feeding Type: Bottle Fed - Formula  LATCH Score/Interventions                      Lactation Tools Discussed/Used     Consult Status      Soma Bachand G 05/20/2016, 7:21 AM

## 2016-05-21 NOTE — Progress Notes (Signed)
Subjective: Postpartum Day 2: Cesarean Delivery Patient reports tolerating PO, + flatus and no problems voiding.   Patient is ambulating w/o difficulty. Breast feeding.   Objective: Vital signs in last 24 hours: Temp:  [97.9 F (36.6 C)-98 F (36.7 C)] 97.9 F (36.6 C) (01/12 [id number]) Pulse Rate:  [73-93] 73 (01/12 0642) Resp:  [18-19] 19 (01/12 0642) BP: (104-133)/(62-83) 104/62 (01/12 [id number])  Physical Exam:  General: alert, cooperative and no distress Lochia: appropriate Uterine Fundus: firm Incision: healing well, no significant drainage, no dehiscence, no significant erythema DVT Evaluation: No evidence of DVT seen on physical exam. Negative Homan's sign. No cords or calf tenderness. No significant calf/ankle edema.   Recent Labs  05/18/16 1005 05/20/16 0516  HGB 10.3* 9.5*  HCT 30.2* 28.2*    Assessment/Plan: Status post Cesarean section. Doing well postoperatively.  Continue current care. Probably discharge tomorrow  Sanjuana Kava [facility] 05/21/2016, 9:12 AM

## 2016-05-21 NOTE — Progress Notes (Signed)
Patient had complete placenta previa with bleeding throughout the pregnancy 1st, second and third trimester bleeding

## 2016-05-21 NOTE — Lactation Note (Signed)
This note was copied from a baby's chart. Lactation Consultation Note  Patient Name: Sarah Berry [id number] Date: 05/21/2016  Follow up visit made.  Mom is currently finger feeding baby.  Mom is teary eyed.  She expresses concern that baby isn't latching and she is not obtaining milk with pumping.  A lot of reassurance given.  Discussed with parents this is very normal feeding behavior for 36.3 weeks and baby will most likely progress nicely with breastfeeding as she reaches term.  Stressed importance of goals for now are to establish and maintain milk supply by pumping 8-12 times/24 hours and today feed baby 20-30 mls every 3 hours. Baby needs increased volume each day.  Talked to parents about changing to a slow flow bottle now that volumes needed are increased.  Parents relieved. Encouraged to nuzzle at breast and allow baby to latch if she is ready and willing.   Maternal Data    Feeding    LATCH Score/Interventions                      Lactation Tools Discussed/Used     Consult Status      Ave Filter 05/21/2016, 4:18 PM

## 2016-05-22 ENCOUNTER — Encounter (HOSPITAL_COMMUNITY): Payer: Self-pay | Admitting: Obstetrics & Gynecology

## 2016-05-22 LAB — TYPE AND SCREEN
ABO/RH(D): O POS
Antibody Screen: NEGATIVE
UNIT DIVISION: 0
Unit division: 0

## 2016-05-22 MED ORDER — OXYCODONE-ACETAMINOPHEN 5-325 MG PO TABS: 1.0000 | ORAL_TABLET | ORAL | 0 refills | Status: DC | PRN

## 2016-05-22 MED ORDER — IBUPROFEN 600 MG PO TABS: 600.0000 mg | ORAL_TABLET | Freq: Four times a day (QID) | ORAL | 3 refills | Status: DC | PRN

## 2016-05-22 NOTE — Discharge Summary (Signed)
Obstetric Discharge Summary Reason for Admission: cesarean section Prenatal Procedures: NST and ultrasound Intrapartum Procedures: cesarean: low cervical, transverse Postpartum Procedures: none Complications-Operative and Postpartum: none Hemoglobin  Date Value Ref Range Status  05/20/2016 9.5 (L) 12.0 - 15.0 g/dL Final   HCT  Date Value Ref Range Status  05/20/2016 28.2 (L) 36.0 - 46.0 % Final    Physical Exam:  General: alert, cooperative and no distress Lochia: appropriate Uterine Fundus: firm Incision: healing well, no significant drainage, no dehiscence, no significant erythema DVT Evaluation: No evidence of DVT seen on physical exam. Negative Homan's sign. No cords or calf tenderness. No significant calf/ankle edema.  Discharge Diagnoses: Placenta previa  Discharge Information: Date: 05/22/2016 Activity: pelvic rest Diet: routine Medications: PNV, Ibuprofen and Percocet Condition: stable Instructions: refer to practice specific booklet Discharge to: home   Newborn Data: Live born female  Birth Weight: 5 lb 3.8 oz (2375 g) APGAR: 9, 9  Home with mother.  Emerick Weatherly, [place] 05/22/2016, 10:55 AM

## 2016-05-22 NOTE — Progress Notes (Signed)
Subjective: Postpartum Day 3: Cesarean Delivery Patient reports tolerating PO, + flatus, + BM and no problems voiding.   Breast feeding w/o difficulty  Objective: Vital signs in last 24 hours: Temp:  [98 F (36.7 C)-98.2 F (36.8 C)] 98 F (36.7 C) (01/13 0545) Pulse Rate:  [81-86] 81 (01/13 0545) Resp:  [18] 18 (01/13 0545) BP: (119)/(75-82) 119/82 (01/13 0545)  Physical Exam:  General: alert, cooperative and no distress Lochia: appropriate Uterine Fundus: firm Incision: healing well, no significant drainage, no dehiscence, no significant erythema DVT Evaluation: No evidence of DVT seen on physical exam. Negative Homan's sign. No cords or calf tenderness. No significant calf/ankle edema.   Recent Labs  05/20/16 0516  HGB 9.5*  HCT 28.2*    Assessment/Plan: Status post Cesarean section. Doing well postoperatively.  Discharge home with standard precautions and return to office  For incision check in 1 week and post partum visit in 4 weeks.   Sarah Berry, [place] 05/22/2016, 10:51 AM

## 2016-05-22 NOTE — Lactation Note (Signed)
This note was copied from a baby's chart. Lactation Consultation Note  Mother is feeling better about her milk supply. Her breasts are filling and are tender. Noted to be pink in the right lower quadrants. Assisted her with therapeutic breast massage of lactation and latching the baby to the right breast with a #24 nipple shield. Swallows were heard and breast softened.  Baby was then positioned on the left breast and she ate a few more minutes. Finished feeding with formula.  Mother pumped and began expressing colostrum. Plan is to BF when able, pump at least 8 times in 24 hours and schedule an OP appointment when baby is closer to term.  Patient Name: Sarah Berry [id number] Date: 05/22/2016 Reason for consult: Follow-up assessment   Maternal Data Has patient been taught Hand Expression?: Yes Does the patient have breastfeeding experience prior to this delivery?: No  Feeding Feeding Type: Breast Fed Length of feed: 15 min  LATCH Score/Interventions Latch: Repeated attempts needed to sustain latch, nipple held in mouth throughout feeding, stimulation needed to elicit sucking reflex.  Audible Swallowing: A few with stimulation  Type of Nipple: Flat  Comfort (Breast/Nipple): Soft / non-tender     Hold (Positioning): Assistance needed to correctly position infant at breast and maintain latch.  LATCH Score: 6  Lactation Tools Discussed/Used     Consult Status Consult Status: Follow-up Follow-up type: Call as needed    Van Clines 05/22/2016, 10:51 AM

## 2016-05-24 ENCOUNTER — Telehealth (HOSPITAL_COMMUNITY): Payer: Self-pay

## 2016-05-24 NOTE — Telephone Encounter (Signed)
Appointment needed related to 36 infant at 11% weight loss on day 5.  Mom reports that she is pumping every 3-4 hours and yielding less than one ounce.  She was pumping every 3 hours but yielded less.  Encouraged her to increase frequency.  She also reports that her breasts are not softening well with pumping.  Gave her tips to help with let down.  Per pediatrician she needs an appointment for tomorrow.

## 2016-05-25 ENCOUNTER — Ambulatory Visit (HOSPITAL_COMMUNITY)
Admission: RE | Admit: 2016-05-25 | Discharge: 2016-05-25 | Disposition: A | Discharge status: Home / Self Care | Payer: BLUE CROSS/BLUE SHIELD | Source: Ambulatory Visit | Attending: Obstetrics & Gynecology | Admitting: Obstetrics & Gynecology

## 2016-05-25 NOTE — Lactation Note (Signed)
Lactation Consult   With this mom and 90 day old LPI. Infant has gained weight almost up to birth weight, is alert, and eager to feed, showing great cues. See suck assessment below, and oral assessment. The baby caused severe biting pain when latched without nipple shield, but with nipple shield,no discomfort.  I showed parents how to advance the baby deep , beyond mom's nipple, and mom was able to note good breast movement. She noted no  discomfort with deep latch. I also reviewed with mom how to correctly apply the shield, so that it stays in place, and pulls more of mom's nipple into shield, with vacuum. The baby was actively swallowing, face full of milk after feeding, and wet burped small amount of milk when placed on the scale.  We then did the same on the left breast, baby sleepy at this time, fed a few minutes only, but did transfer some. Total transfer 28 ml's, and baby then fed with bottle, taking an additioall 60 ml's. Baby doing great, mom very relieved. I then had mom pump with [employer], and with lots of massage along with pumping, mom expressed an additional 45 ml's, which she will feed the baby at her next feeding.Mom 's breast now softened, no more full milk ducts.  Mom rented a [employer]P, and made a follow up lacation appointment.   Mother's reason for visit:  Baby referred by [facility], Dr. Erlene Quan, for 11 % weight l;oss Visit Type:o/p lactation Appointment Notes: LPI    now 56 days old Consult:  Follow up  Lactation Consultant:  Tonna Corner  ________________________________________________________________________  [name] Name:  Sarah Berry Date of Birth: 05/19/2018 Pediatrician:  Dr. Erlene Quan, [facility] Gender:  female Gestational Age:45 Birth Weight:  5 lbs 13 oz  Weight at Discharge:     4 lbs 13 oz                     Date of Discharge:   There were no vitals filed for this visit. Last weight taken from location outside of [facility]: 5 lbs 4 oz    Location:Pediatrician's office Weight today 2308 gram      5 lbs 1.3 oz  ________________________________________________________________________  Mother's Name: Sarah Berry Type of delivery:  c-section Breastfeeding Experience:  First baby Maternal Medical Conditions:  none Maternal Medications:  none  ________________________________________________________________________  Breastfeeding History (Post Discharge)  Frequency of breastfeeding:  Every 3-4 hours, 6-8 times a day Duration of feeding:  2-15 minutes  Supplementation  Formula:  Volume 60 - 90 ml Frequency:  Every 3 hours Total volume per day:  14440 - 2140ml       Brand: Similac  Alimentum 20 cal  Breastmilk:  Volume 0-69ml Frequency:  4-6 times a day Total volume per day:  60-18ml  Method:  Bottle,   Pumping  Type of pump:  Medela pump in style Frequency:  Every 3 hours, 8 times a day Volume:  15 ml    Infant Intake and Output Assessment  Voids:  7 in  24 hrs.  Color:  Clear yellow Stools:  7 in 24 hrs.  Color:  Yellow  ________________________________________________________________________  Maternal Breast Assessment  Breast:  Full and Compressible Nipple:  Erect Pain level:  10   Without nipple shield, 0 with nipple shield Pain interventions:  Bra, Expressed breast milk, Breast pump and Nipple shield  _______________________________________________________________________ Feeding Assessment/Evaluation  Initial feeding assessment:  Infant's oral assessment:  Variance -  baby has upper  Lip frenulum that extends to gum lin, very thick, and short posterior frenulum, imbedded in thick tissue, tongue with extension restriction, mom reports "biting", and severe pain with latch, if nipple shield not used  Positioning:  Cross cradle Right breast  LATCH documentation:  Latch:  2 = Grasps breast easily, tongue down, lips flanged, rhythmical sucking.  Audible swallowing:  2 = Spontaneous and  intermittent  Type of nipple:  2 = Everted at rest and after stimulation  Comfort (Breast/Nipple):  1 = Filling, red/small blisters or bruises, mild/mod discomfort  Hold (Positioning):  1 = Assistance needed to correctly position infant at breast and maintain latch  LATCH score:  8  Attached assessment:  Deep  Lips flanged:  Yes.    Lips untucked:  Yes.    Suck assessment:  Displays both - non nutritive if not advanced deeply on breast with nipple shield, or no shield, nutritive with deep latch with shield. Baby has a strong suck on my gloved finger, but can not make a seal and pull my finger into her mouth. High palate noted also  Tools:  Nipple shield 24 mm and Bottle Instructed on use and cleaning of tool:  Yes.    Pre-feed weight:  2308 g  (5 lb. 1.3 oz.) Post-feed weight:  2332 g  Amount transferred:  40ml Amount supplemented:  Latched to second breast prior to bottle supplement ml  Additional Feeding Assessment -   Infant's oral assessment:  Variance  Positioning:  Football Left breast  LATCH documentation:  Latch:  1 = Repeated attempts needed to sustain latch, nipple held in mouth throughout feeding, stimulation needed to elicit sucking reflex.  Audible swallowing:  1 = A few with stimulation  Type of nipple:  2 = Everted at rest and after stimulation  Comfort (Breast/Nipple):  1 = Filling, red/small blisters or bruises, mild/mod discomfort  Hold (Positioning):  1 = Assistance needed to correctly position infant at breast and maintain latch  LATCH score:  6  Attached assessment:  Deep  Lips flanged:  Yes.    Lips untucked:  Yes.    Suck assessment:  Nutritive  Tools:  Nipple shield 24 mm Instructed on use and cleaning of tool:  No.  Pre-feed weight:  2332 g   Post-feed weight:  2336 g  Amount transferred:  75ml Amount supplemented:  60 ml    Total amount pumped post feed:  R 15 ml    L 30 ml  Total amount transferred:  28 ml Total supplement given:  60  ml

## 2016-06-01 ENCOUNTER — Encounter (HOSPITAL_COMMUNITY): Payer: Self-pay

## 2016-06-01 ENCOUNTER — Inpatient Hospital Stay (HOSPITAL_COMMUNITY)
Admission: AD | Admit: 2016-06-01 | Discharge: 2016-06-03 | Disposition: A | Discharge status: Home / Self Care | Payer: BLUE CROSS/BLUE SHIELD | Source: Ambulatory Visit | Attending: Obstetrics and Gynecology | Admitting: Obstetrics and Gynecology

## 2016-06-01 DIAGNOSIS — R51 Headache: Secondary | ICD-10-CM | POA: Diagnosis present

## 2016-06-01 DIAGNOSIS — O1495 Unspecified pre-eclampsia, complicating the puerperium: Secondary | ICD-10-CM | POA: Diagnosis not present

## 2016-06-01 DIAGNOSIS — O165 Unspecified maternal hypertension, complicating the puerperium: Secondary | ICD-10-CM

## 2016-06-01 LAB — URINALYSIS, ROUTINE W REFLEX MICROSCOPIC
BILIRUBIN URINE: NEGATIVE
GLUCOSE, UA: NEGATIVE mg/dL
Ketones, ur: NEGATIVE mg/dL
NITRITE: NEGATIVE
PH: 6 (ref 5.0–8.0)
Protein, ur: NEGATIVE mg/dL
SPECIFIC GRAVITY, URINE: 1.006 (ref 1.005–1.030)

## 2016-06-01 LAB — COMPREHENSIVE METABOLIC PANEL
ALBUMIN: 3.7 g/dL (ref 3.5–5.0)
ALK PHOS: 68 U/L (ref 38–126)
ALT: 65 U/L — ABNORMAL HIGH (ref 14–54)
AST: 33 U/L (ref 15–41)
Anion gap: 9 (ref 5–15)
BILIRUBIN TOTAL: 0.5 mg/dL (ref 0.3–1.2)
BUN: 18 mg/dL (ref 6–20)
CALCIUM: 9.1 mg/dL (ref 8.9–10.3)
CO2: 25 mmol/L (ref 22–32)
Chloride: 106 mmol/L (ref 101–111)
Creatinine, Ser: 0.8 mg/dL (ref 0.44–1.00)
GLUCOSE: 83 mg/dL (ref 65–99)
Potassium: 4.1 mmol/L (ref 3.5–5.1)
Sodium: 140 mmol/L (ref 135–145)
TOTAL PROTEIN: 7 g/dL (ref 6.5–8.1)

## 2016-06-01 LAB — PROTEIN / CREATININE RATIO, URINE: CREATININE, URINE: 36 mg/dL

## 2016-06-01 LAB — CBC
HCT: 32.8 % — ABNORMAL LOW (ref 36.0–46.0)
Hemoglobin: 11.2 g/dL — ABNORMAL LOW (ref 12.0–15.0)
MCH: 29.4 pg (ref 26.0–34.0)
MCHC: 34.1 g/dL (ref 30.0–36.0)
MCV: 86.1 fL (ref 78.0–100.0)
Platelets: 394 10*3/uL (ref 150–400)
RBC: 3.81 MIL/uL — ABNORMAL LOW (ref 3.87–5.11)
RDW: 12.9 % (ref 11.5–15.5)
WBC: 9.1 10*3/uL (ref 4.0–10.5)

## 2016-06-01 MED ORDER — LABETALOL HCL 5 MG/ML IV SOLN
20.0000 mg | INTRAVENOUS | Status: DC | PRN
  Administered 2016-06-01: 40 mg via INTRAVENOUS
  Administered 2016-06-01: 20 mg via INTRAVENOUS
  Filled 2016-06-01 (×2): qty 8
  Filled 2016-06-01: qty 4

## 2016-06-01 MED ORDER — HYDRALAZINE HCL 20 MG/ML IJ SOLN: 10.0000 mg | Freq: Once | INTRAMUSCULAR | Status: DC | PRN

## 2016-06-01 MED ORDER — LABETALOL HCL 5 MG/ML IV SOLN: 20.0000 mg | INTRAVENOUS | Status: DC | PRN

## 2016-06-01 MED ORDER — BUTALBITAL-APAP-CAFFEINE 50-325-40 MG PO TABS
2.0000 | ORAL_TABLET | Freq: Once | ORAL | Status: AC
  Administered 2016-06-01: 2 via ORAL
  Filled 2016-06-01: qty 2

## 2016-06-01 MED ORDER — IBUPROFEN 800 MG PO TABS
800.0000 mg | ORAL_TABLET | Freq: Once | ORAL | Status: AC
  Administered 2016-06-01: 800 mg via ORAL
  Filled 2016-06-01: qty 1

## 2016-06-01 NOTE — MAU Note (Signed)
C/s on 1/10.  BP 140/90 when home nurse checked it yesterday.  HA today, not going away.  MD told her to come in. Baby is doing well.  denies visual changes, epigastric pain, swelling has improved

## 2016-06-01 NOTE — MAU Provider Note (Signed)
History   Patient Sarah Berry is a 31 year old G1P1 s/p c/section on 05/19/2016. She has no history of high blood pressure until 1 week ago, when her blood pressure was found to be elevated at a 1 week follow-up visit. Today her blood pressure was found to be 140/90 at her home nurse appointment. She also had a headache this morning that was a 3/10. Patient denies vomiting, nausea, epigastric pain, blurry vision or floating spots. She came in to the MAU to evaluated.   CSN: [id number]  Arrival date and time: 06/01/16 1558   None     No chief complaint on file.  Headache   This is a new problem. The current episode started today. The problem has been unchanged. The pain is located in the bilateral and frontal region. The pain does not radiate. The quality of the pain is described as aching and dull. The pain is at a severity of 3/10. The pain is mild. Pertinent negatives include no abdominal pain, blurred vision, coughing, dizziness, nausea, neck pain, numbness, phonophobia, photophobia, seizures, visual change, vomiting or weakness. Nothing aggravates the symptoms. She has tried nothing for the symptoms. The treatment provided no relief.    OB History    Gravida Para Term Preterm AB Living   1 1   1   1    SAB TAB Ectopic Multiple Live Births         0 1      Past Medical History:  Diagnosis Date  . Fibroid   . Infertility, female, primary   . Newborn product of in vitro fertilization (IVF) pregnancy   . Placenta previa     Past Surgical History:  Procedure Laterality Date  . CESAREAN SECTION N/A 05/19/2016   Procedure: CESAREAN SECTION;  Surgeon: Sanjuana Kava, MD;  Location: [place];  Service: Obstetrics;  Laterality: N/A;  . egg retrieval    . MYOMECTOMY    . WISDOM TOOTH EXTRACTION      Family History  Problem Relation Age of Onset  . Cancer Paternal Grandmother   . Hypertension Mother   . Hypertension Father     Social History  Substance Use Topics  .  Smoking status: Never Smoker  . Smokeless tobacco: Never Used  . Alcohol use No    Allergies:  Allergies  Allergen Reactions  . Bactrim [Sulfamethoxazole-Trimethoprim] Anaphylaxis and Other (See Comments)    "childhood allergy"    Prescriptions Prior to Admission  Medication Sig Dispense Refill Last Dose  . ibuprofen (ADVIL,MOTRIN) 600 MG tablet Take 1 tablet (600 mg total) by mouth every 6 (six) hours as needed for moderate pain or cramping. 60 tablet 3 06/01/2016 at Unknown time  . oxyCODONE-acetaminophen (PERCOCET/ROXICET) 5-325 MG tablet Take 1-2 tablets by mouth every 4 (four) hours as needed for severe pain (pain scale 4-7). (Patient not taking: Reported on 06/01/2016) 45 tablet 0 Not Taking at Unknown time    Review of Systems  Constitutional: Negative.   HENT: Negative.   Eyes: Negative.  Negative for blurred vision and photophobia.  Respiratory: Negative.  Negative for cough.   Cardiovascular: Negative.   Gastrointestinal: Negative for abdominal pain, nausea and vomiting.  Endocrine: Negative.   Genitourinary: Negative.   Musculoskeletal: Negative.  Negative for neck pain.  Allergic/Immunologic: Negative.   Neurological: Positive for headaches. Negative for dizziness, seizures, weakness and numbness.  Psychiatric/Behavioral: Negative.    Physical Exam   Blood pressure 160/97, pulse 66, temperature 98.2 F (36.8 C), temperature source  Oral, resp. rate 16, currently breastfeeding.  Physical Exam  Constitutional: She is oriented to person, place, and time. She appears well-developed.  HENT:  Head: Normocephalic.  Neck: Normal range of motion.  Respiratory: Effort normal. No respiratory distress. She has no wheezes. She has no rales. She exhibits no tenderness.  GI: Soft. She exhibits no distension and no mass. There is no tenderness. There is no rebound and no guarding.  Neurological: She is alert and oriented to person, place, and time.  Skin: Skin is warm and dry.   Psychiatric: She has a normal mood and affect.    MAU Course  Procedures  MDM -IV; 1 dose of 20 mg of labetelol given at approximately 1746. Pressure decreased after one dose of labetalol. Pressure reached 169/105 at 2056; 40 mg of labetalol and pressure now 150s/90s.  -ALT is 62, all other preeclamptic labs are negative.   -1 dose of fioricet for HA at 1854; HA is still the same at 1945. 600 mg of ibuprofen given at 2055; HA still unrelieved.   Assessment and Plan  1. Per Dr. Philis Pique, patient to be admitted to Antepartum for observation overnight.   Mervyn Skeeters Hamda Klutts CNM 06/01/2016, 7:36 PM

## 2016-06-01 NOTE — MAU Note (Signed)
Urine in lab 

## 2016-06-02 ENCOUNTER — Ambulatory Visit (HOSPITAL_COMMUNITY): Admission: RE | Admit: 2016-06-02 | Payer: BLUE CROSS/BLUE SHIELD | Source: Ambulatory Visit

## 2016-06-02 DIAGNOSIS — O165 Unspecified maternal hypertension, complicating the puerperium: Secondary | ICD-10-CM

## 2016-06-02 LAB — COMPREHENSIVE METABOLIC PANEL
ALT: 58 U/L — AB (ref 14–54)
ANION GAP: 9 (ref 5–15)
AST: 27 U/L (ref 15–41)
Albumin: 3.4 g/dL — ABNORMAL LOW (ref 3.5–5.0)
Alkaline Phosphatase: 55 U/L (ref 38–126)
BUN: 15 mg/dL (ref 6–20)
CHLORIDE: 107 mmol/L (ref 101–111)
CO2: 24 mmol/L (ref 22–32)
CREATININE: 0.8 mg/dL (ref 0.44–1.00)
Calcium: 8.3 mg/dL — ABNORMAL LOW (ref 8.9–10.3)
Glucose, Bld: 89 mg/dL (ref 65–99)
POTASSIUM: 3.3 mmol/L — AB (ref 3.5–5.1)
SODIUM: 140 mmol/L (ref 135–145)
Total Bilirubin: 0.6 mg/dL (ref 0.3–1.2)
Total Protein: 6.5 g/dL (ref 6.5–8.1)

## 2016-06-02 LAB — CBC
HCT: 31.6 % — ABNORMAL LOW (ref 36.0–46.0)
HEMOGLOBIN: 10.2 g/dL — AB (ref 12.0–15.0)
MCH: 28.6 pg (ref 26.0–34.0)
MCHC: 32.3 g/dL (ref 30.0–36.0)
MCV: 88.5 fL (ref 78.0–100.0)
Platelets: 346 10*3/uL (ref 150–400)
RBC: 3.57 MIL/uL — AB (ref 3.87–5.11)
RDW: 12.6 % (ref 11.5–15.5)
WBC: 7.4 10*3/uL (ref 4.0–10.5)

## 2016-06-02 LAB — PROTEIN / CREATININE RATIO, URINE: CREATININE, URINE: 88 mg/dL

## 2016-06-02 MED ORDER — NIFEDIPINE ER OSMOTIC RELEASE 30 MG PO TB24
30.0000 mg | ORAL_TABLET | Freq: Every day | ORAL | Status: DC
  Administered 2016-06-02: 30 mg via ORAL
  Filled 2016-06-02: qty 1

## 2016-06-02 MED ORDER — ZOLPIDEM TARTRATE 5 MG PO TABS
5.0000 mg | ORAL_TABLET | Freq: Every evening | ORAL | Status: DC | PRN
  Administered 2016-06-02: 5 mg via ORAL
  Filled 2016-06-02: qty 1

## 2016-06-02 MED ORDER — IBUPROFEN 800 MG PO TABS
800.0000 mg | ORAL_TABLET | Freq: Three times a day (TID) | ORAL | Status: DC | PRN
  Administered 2016-06-02 – 2016-06-03 (×3): 800 mg via ORAL
  Filled 2016-06-02 (×3): qty 1

## 2016-06-02 NOTE — Progress Notes (Signed)
Patient states she does not need to go to the bathroom at this time.

## 2016-06-02 NOTE — Progress Notes (Signed)
Post Partum Day 14 Subjective: no complaints, Patient says her headache has resolved She denies RUQ pain, no visual changes, She is up ad lib, voiding, tolerating PO and + flatus  Objective: Blood pressure (!) 144/78, pulse 66, temperature 98.6 F (37 C), temperature source Oral, resp. rate 16, height 5\' 7"  (1.702 m), weight 84.4 kg (186 lb), currently breastfeeding.  Physical Exam:  General: alert, cooperative and no distress Lochia: appropriate Uterine Fundus: firm Incision: healing well DVT Evaluation: No evidence of DVT seen on physical exam. Negative Homan's sign. No cords or calf tenderness. No significant calf/ankle edema.   Recent Labs  06/01/16 1731 06/02/16 0546  HGB 11.2* 10.2*  HCT 32.8* 31.6*    Assessment/Plan: 31 yo G1P0101 with postpartum preeclampsia LFTs were mildly elevated and now are trending down BPs overnight were elevated one severe range Will start Nifedipine XL 30mg  daily and watch BPs closely today Since neurologic sx have resolved will hold off on starting magnesium sulfate.   LOS: 0 days   Elen Acero, [place] 06/02/2016, 8:23 AM

## 2016-06-03 LAB — COMPREHENSIVE METABOLIC PANEL
ALBUMIN: 3.5 g/dL (ref 3.5–5.0)
ALK PHOS: 64 U/L (ref 38–126)
ALT: 58 U/L — ABNORMAL HIGH (ref 14–54)
ANION GAP: 9 (ref 5–15)
AST: 29 U/L (ref 15–41)
BUN: 16 mg/dL (ref 6–20)
CO2: 24 mmol/L (ref 22–32)
Calcium: 8.6 mg/dL — ABNORMAL LOW (ref 8.9–10.3)
Chloride: 107 mmol/L (ref 101–111)
Creatinine, Ser: 0.71 mg/dL (ref 0.44–1.00)
GFR calc Af Amer: >60 mL/min (ref >60)
GFR calc non Af Amer: >60 mL/min (ref >60)
GLUCOSE: 94 mg/dL (ref 65–99)
POTASSIUM: 3.8 mmol/L (ref 3.5–5.1)
SODIUM: 140 mmol/L (ref 135–145)
Total Bilirubin: 0.1 mg/dL — ABNORMAL LOW (ref 0.3–1.2)
Total Protein: 6.3 g/dL — ABNORMAL LOW (ref 6.5–8.1)

## 2016-06-03 MED ORDER — NIFEDIPINE ER 60 MG PO TB24: 60.0000 mg | ORAL_TABLET | Freq: Every day | ORAL | 1 refills | Status: DC

## 2016-06-03 MED ORDER — NIFEDIPINE ER OSMOTIC RELEASE 30 MG PO TB24
60.0000 mg | ORAL_TABLET | Freq: Every day | ORAL | Status: DC
  Administered 2016-06-03: 60 mg via ORAL
  Filled 2016-06-03: qty 2

## 2016-06-03 NOTE — Discharge Instructions (Signed)
Postpartum Hypertension  Postpartum hypertension is high blood pressure after pregnancy that remains higher than normal for more than two days after delivery. You may not realize that you have postpartum hypertension if your blood pressure is not being checked regularly. In some cases, postpartum hypertension will go away on its own, usually within a week of delivery. However, for some women, medical treatment is required to prevent serious complications, such as seizures or stroke.  The following things can affect your blood pressure:  · The type of delivery you had.  · Having received IV fluids or other medicines during or after delivery.    What are the causes?  Postpartum hypertension may be caused by any of the following or by a combination of any of the following:  · Hypertension that existed before pregnancy (chronic hypertension).  · Gestational hypertension.  · Preeclampsia or eclampsia.  · Receiving a lot of fluid through an IV during or after delivery.  · Medicines.  · HELLP syndrome.  · Hyperthyroidism.  · Stroke.  · Other rare neurological or blood disorders.    In some cases, the cause may not be known.  What increases the risk?  Postpartum hypertension can be related to one or more risk factors, such as:  · Chronic hypertension. In some cases, this may not have been diagnosed before pregnancy.  · Obesity.  · Type 2 diabetes.  · Kidney disease.  · Family history of preeclampsia.  · Other medical conditions that cause hormonal imbalances.    What are the signs or symptoms?  As with all types of hypertension, postpartum hypertension may not have any symptoms. Depending on how high your blood pressure is, you may experience:  · Headaches. These may be mild, moderate, or severe. They may also be steady, constant, or sudden in onset (thunderclap headache).  · Visual changes.  · Dizziness.  · Shortness of breath.  · Swelling of your hands, feet, lower legs, or face. In some cases, you may have swelling in  more than one of these locations.  · Heart palpitations or a racing heartbeat.  · Difficulty breathing while lying down.  · Decreased urination.    Other rare signs and symptoms may include:  · Sweating more than usual. This lasts longer than a few days after delivery.  · Chest pain.  · Sudden dizziness when you get up from sitting or lying down.  · Seizures.  · Nausea or vomiting.  · Abdominal pain.    How is this diagnosed?  The diagnosis of postpartum hypertension is made through a combination of physical examination findings and testing of your blood and urine. You may also have additional tests, such as a CT scan or an MRI, to check for other complications of postpartum hypertension.  How is this treated?  When blood pressure is high enough to require treatment, your options may include:  · Medicines to reduce blood pressure (antihypertensives). Tell your health care provider if you are breastfeeding or if you plan to breastfeed. There are many antihypertensive medicines that are safe to take while breastfeeding.  · Stopping medicines that may be causing hypertension.  · Treating medical conditions that are causing hypertension.  · Treating the complications of hypertension, such as seizures, stroke, or kidney problems.    Your health care provider will also continue to monitor your blood pressure closely and repeatedly until it is within a safe range for you.  Follow these instructions at home:  · Take medicines only   as directed by your health care provider.  · Get regular exercise after your health care provider tells you that it is safe.  · Follow your health care provider’s recommendations on fluid and salt restrictions.  · Do not use any tobacco products, including cigarettes, chewing tobacco, or electronic cigarettes. If you need help quitting, ask your health care provider.  · Keep all follow-up visits as directed by your health care provider. This is important.  Contact a health care provider  if:  · Your symptoms get worse.  · You have new symptoms, such as:  ? Headache.  ? Dizziness.  ? Visual changes.  Get help right away if:  · You develop a severe or sudden headache.  · You have seizures.  · You develop numbness or weakness on one side of your body.  · You have difficulty thinking, speaking, or swallowing.  · You develop severe abdominal pain.  · You develop difficulty breathing, chest pain, a racing heartbeat, or heart palpitations.  These symptoms may represent a serious problem that is an emergency. Do not wait to see if the symptoms will go away. Get medical help right away. Call your local emergency services (911 in the [place] Do not drive yourself to the hospital.  This information is not intended to replace advice given to you by your health care provider. Make sure you discuss any questions you have with your health care provider.  Document Released: 12/28/2013 Document Revised: 09/29/2015 Document Reviewed: 11/08/2013  Elsevier Interactive Patient Education © 2017 [employer]

## 2016-06-03 NOTE — Discharge Summary (Signed)
Physician Discharge Summary  Patient ID: Sarah Berry MRN: [id number] DOB/AGE: 33-20-1987 33 y.o.  Admit date: 06/01/2016 Discharge date: 06/03/2016  Admission Diagnoses: postpartum hypertension  Discharge Diagnoses:  Active Problems:   Postpartum hypertension   Discharged Condition: good  Hospital Course: g1p0101 admitted on POD#12 with postpartum hypertension.  Received IV anti-hypertensive therapy on presentation to MAu with severe range BPs.  She endorsed a mild headache unrelieved with multiple medications.  She was admitted for inpatient management.  She was started on procardia XL and it was titrated up to 60mg  PO daily.  Headache resolved and blood pressures were consistently <140/90 on day of discharge.  Discharge Exam: Blood pressure 126/75, pulse 81, temperature 98.4 F (36.9 C), temperature source Oral, resp. rate 16, height 5\' 7"  (1.702 m), weight 84.4 kg (186 lb), SpO2 98 %, currently breastfeeding.   Disposition: 01-Home or Self Care  Discharge Instructions    Activity as tolerated    Complete by:  As directed    Call MD for:    Complete by:  As directed    Severe headache unrelieved by pain medication, spots in vision or right upper quadrant pain   Call MD for:  difficulty breathing, headache or visual disturbances    Complete by:  As directed    Call MD for:  extreme fatigue    Complete by:  As directed    Call MD for:  hives    Complete by:  As directed    Call MD for:  persistant dizziness or light-headedness    Complete by:  As directed    Call MD for:  persistant nausea and vomiting    Complete by:  As directed    Call MD for:  redness, tenderness, or signs of infection (pain, swelling, redness, odor or green/yellow discharge around incision site)    Complete by:  As directed    Call MD for:  severe uncontrolled pain    Complete by:  As directed    Call MD for:  temperature >100.4    Complete by:  As directed    Lifting restrictions    Complete by:   As directed    Weight restriction of 10 lbs.   No wound care    Complete by:  As directed      Allergies as of 06/03/2016      Reactions   Bactrim [sulfamethoxazole-trimethoprim] Anaphylaxis, Other (See Comments)   "childhood allergy"      Medication List    TAKE these medications   ibuprofen 600 MG tablet Commonly known as:  ADVIL,MOTRIN Take 1 tablet (600 mg total) by mouth every 6 (six) hours as needed for moderate pain or cramping.   NIFEdipine 60 MG 24 hr tablet Commonly known as:  PROCARDIA-XL/ADALAT CC Take 1 tablet (60 mg total) by mouth daily. Start taking on:  06/04/2016   oxyCODONE-acetaminophen 5-325 MG tablet Commonly known as:  PERCOCET/ROXICET Take 1-2 tablets by mouth every 4 (four) hours as needed for severe pain (pain scale 4-7).      Follow up in office in 4-5 days for BP check  Signed: [place] 06/03/2016, 4:02 PM

## 2016-06-03 NOTE — Progress Notes (Signed)
Post Partum Day 15  Subjective: Doing well.  Denies HA at this time.  Pt was noted to have two BPs overnight and early this AM in the 150s/90s.  Her dose of procardia Xl was increased to 60mg  daily.  BPs have been < 140/90 since that time.   She denies RUQ pain, no visual changes, She is up ad lib, voiding, tolerating PO and + flatus  Objective: Blood pressure 126/75, pulse 81, temperature 98.4 F (36.9 C), temperature source Oral, resp. rate 16, height 5\' 7"  (1.702 m), weight 84.4 kg (186 lb), SpO2 98 %, currently breastfeeding.  Physical Exam:  General: alert, cooperative and no distress Uterine Fundus: firm Incision: healing well DVT Evaluation: No evidence of DVT seen on physical exam. Negative Homan's sign. No cords or calf tenderness. No significant calf/ankle edema.   Recent Labs  06/01/16 1731 06/02/16 0546  HGB 11.2* 10.2*  HCT 32.8* 31.6*    Assessment/Plan: 31 yo G1P0101 with postpartum hypertension BPs controlled with procardia XL 60 mg and she is asymptomatic.  Mild elevation of ALT on admission, has trended down.  Cr. 0.7.  Will d/c home w BP meds and f/u in office for BP check in 4-5 days.   S/sx preeclampsia reviewed  [place] 06/03/2016, 3:57 PM

## 2016-06-03 NOTE — Progress Notes (Signed)
Discharge teaching complete with pt. Pt understood all information and did not have any questions. Pt ambulated out of the hospital and discharged home to family.  

## 2016-06-11 ENCOUNTER — Ambulatory Visit: Payer: Self-pay

## 2016-06-11 NOTE — Lactation Note (Signed)
This note was copied from a baby's chart. Lactation Consult  Mother's reason for visit:  LMS, trouble with latch Visit Type:  Outpatient Appointment Notes:  Follow up visit, Mom still having trouble with latch. Using 24 nipple shield but baby is sleepy at breast. Mom started Reglan yesterday for LMS. Consult:  Follow-Up Lactation Consultant:  Sarah Berry  ________________________________________________________________________   [name] Name:  Sarah Berry Date of Birth:  05/19/2018 Pediatrician:  Sarah Berry  Gender:  female Gestational Age: [redacted]w[redacted]d (At Birth) Birth Weight:  5 lb 3.8 oz (2375 g) Weight at Discharge:                          Date of Discharge:   There were no vitals filed for this visit. Last weight taken from location outside of [facility]:  6 lb. 7.0 oz 06/10/16    Location:Pediatrician's office Weight today:  6 lb. 9.4 oz/2988 grams with clean diaper, baby now 42 weeks old ________________________________________________________________________  Mother's Name: Sarah Berry Type of delivery:  C-Section, Vacuum Assisted Breastfeeding Experience:  P1 Maternal Medical Conditions:  Infertility, HTN Maternal Medications:  Procardia, Reglan QID  ________________________________________________________________________  Breastfeeding History (Post Discharge)  Frequency of breastfeeding:  3-4 times/day Duration of feeding:  10 minutes each breast  Mom has Symphony DEBP and is pumping 3-4 times per day receiving between 10-50 ml total Mom is supplementing baby with Alimentum 50-85 ml every 3 hours using Avent bottle.  Infant Intake and Output Assessment  Voids:  10-12 in 24 hrs.  Color:  Clear yellow Stools:  1-3 in 24 hrs.  Color:  Brown and Yellow  ________________________________________________________________________  Maternal Breast Assessment  Breast:  Soft Nipple:  Erect right nipple, left nipple is flat. Both  compressible  _______________________________________________________________________ Feeding Assessment/Evaluation  Initial feeding assessment:  Infant's oral assessment:  WNL  Positioning:  Cross cradle Left breast  LATCH documentation:  Latch:  2 = Grasps breast easily, tongue down, lips flanged, rhythmical sucking. Using 24 nipple shield. Lots of non-nutritive suckling observed, sleepy at breast  Audible swallowing:  0 = None  Type of nipple:  1 = Flat, short nipple shaft, compressible  Comfort (Breast/Nipple):  2 = Soft / non-tender  Hold (Positioning):  1 = Assistance needed to correctly position infant at breast and maintain latch  LATCH score:  6  Attached assessment:  Deep  Lips flanged:  Yes.    Lips untucked:  Yes.    Suck assessment:  Few nutritive suckles but a lot of non-nutritive suckles noted. Small amount of breast milk in nipple shield   Tools:  Nipple shield 24 mm Instructed on use and cleaning of tool:  Yes.    Pre-feed weight:  2988 g  (6 lb. 9.4 oz.) Post-feed weight:  2992 g (6 lb. 9.5 oz.) Amount transferred:  4 ml with nursing for 14 minutes  Additional Feeding Assessment -   Infant's oral assessment:  WNL  Positioning:  Cross cradle Right breast  LATCH documentation:  Latch:  2 = Grasps breast easily, tongue down, lips flanged, rhythmical sucking.  Without nipple shield on right breast  Audible swallowing:  2 = Spontaneous and intermittent  Type of nipple:  2 = Everted at rest and after stimulation  Comfort (Breast/Nipple):  2 = Soft / non-tender  Hold (Positioning):  1 = Assistance needed to correctly position infant at breast and maintain latch  LATCH score:  9  Attached assessment:  Deep  Lips flanged:  Yes.    Lips untucked:  Yes  Suck assessment: Nutritive suckling noted  Pre-feed weight:  2992 g  (6 lb. 9.5 oz.) Post-feed weight:  3016 g (6 lb. 10.4 oz.) Amount transferred:  24 ml with nursing for 13 minutes without nipple shield  on right breast.   Set up SNS and assisted Mom to re-latch baby to left breast using 24 nipple shield and SNS at breast. Baby took 34 ml of Alimentum with an additional 4 ml breast milk from breast while using SNS to supplement over 10 minute period. Baby demonstrated a much more nutritive suckling pattern with supplement at breast. Advised Mom this will help to increase her milk supply since baby has a more nutritive pattern especially on left breast which is producing less milk.   Demonstrated set up and cleaning of starter SNS and Double SNS.   Total amount pumped post feed:  R 17 ml    L 10 ml with pumping for 15 minutes using Symphony DEBP  Total amount transferred:  32 ml Total supplement given:  34 ml  Plan discussed with Mom: Try to offer breast with each feeding - at least 8 times in 24 hours. Use 24 nipple shield on left breast. Continue to work with latch on right breast without nipple shield but use nipple shield as needed. Continue to supplement with each feeding 50-85 ml or enough to satisfy baby. During the day, try to use SNS to supplement at breast. Advised the more stimulation to breast, the greater chance of maximizing milk volume. Start BF on 1st breast without the SNS, use SNS to supplement while baby is nursing on 2nd breast. Alternate each feeding which breast you start the feeding and which breast you use the SNS. Try to pump after each feeding for 15-20 minutes. Consider pump/bottle feeding at night.  If this plan is overwhelming put baby to breast as often as you can and continue pump/bottle to encourage and protect milk supply. Encouraged OP follow up again in 2-3 weeks to see if milk transfer continuing to improve and milk volume improving, Mom will advise. Encouraged to come to support group.  Cautioned Mom to be aware of some risk of depression using Reglan, advised to contact OB if occurs.

## 2016-07-20 ENCOUNTER — Other Ambulatory Visit: Payer: Self-pay | Admitting: Obstetrics & Gynecology

## 2016-07-21 LAB — CYTOLOGY - PAP

## 2017-04-19 DIAGNOSIS — R030 Elevated blood-pressure reading, without diagnosis of hypertension: Secondary | ICD-10-CM | POA: Diagnosis not present

## 2017-04-19 DIAGNOSIS — I10 Essential (primary) hypertension: Secondary | ICD-10-CM | POA: Diagnosis not present

## 2017-04-19 DIAGNOSIS — Z30011 Encounter for initial prescription of contraceptive pills: Secondary | ICD-10-CM | POA: Diagnosis not present

## 2017-04-19 DIAGNOSIS — F4323 Adjustment disorder with mixed anxiety and depressed mood: Secondary | ICD-10-CM | POA: Diagnosis not present

## 2017-07-14 DIAGNOSIS — F4325 Adjustment disorder with mixed disturbance of emotions and conduct: Secondary | ICD-10-CM | POA: Diagnosis not present

## 2017-08-15 DIAGNOSIS — L219 Seborrheic dermatitis, unspecified: Secondary | ICD-10-CM | POA: Diagnosis not present

## 2017-08-15 DIAGNOSIS — L710 Perioral dermatitis: Secondary | ICD-10-CM | POA: Diagnosis not present

## 2017-08-15 DIAGNOSIS — D225 Melanocytic nevi of trunk: Secondary | ICD-10-CM | POA: Diagnosis not present

## 2017-08-15 DIAGNOSIS — L700 Acne vulgaris: Secondary | ICD-10-CM | POA: Diagnosis not present

## 2017-08-16 DIAGNOSIS — L700 Acne vulgaris: Secondary | ICD-10-CM | POA: Diagnosis not present

## 2017-08-16 DIAGNOSIS — L710 Perioral dermatitis: Secondary | ICD-10-CM | POA: Diagnosis not present

## 2017-08-18 DIAGNOSIS — F4325 Adjustment disorder with mixed disturbance of emotions and conduct: Secondary | ICD-10-CM | POA: Diagnosis not present

## 2017-09-05 DIAGNOSIS — F4325 Adjustment disorder with mixed disturbance of emotions and conduct: Secondary | ICD-10-CM | POA: Diagnosis not present

## 2017-09-07 DIAGNOSIS — F418 Other specified anxiety disorders: Secondary | ICD-10-CM | POA: Diagnosis not present

## 2017-09-07 DIAGNOSIS — Z0000 Encounter for general adult medical examination without abnormal findings: Secondary | ICD-10-CM | POA: Diagnosis not present

## 2017-09-07 DIAGNOSIS — R000 Tachycardia, unspecified: Secondary | ICD-10-CM | POA: Diagnosis not present

## 2017-09-07 DIAGNOSIS — Z1389 Encounter for screening for other disorder: Secondary | ICD-10-CM | POA: Diagnosis not present

## 2017-09-07 DIAGNOSIS — R740 Nonspecific elevation of levels of transaminase and lactic acid dehydrogenase [LDH]: Secondary | ICD-10-CM | POA: Diagnosis not present

## 2017-09-12 DIAGNOSIS — F4325 Adjustment disorder with mixed disturbance of emotions and conduct: Secondary | ICD-10-CM | POA: Diagnosis not present

## 2017-09-13 DIAGNOSIS — Z01419 Encounter for gynecological examination (general) (routine) without abnormal findings: Secondary | ICD-10-CM | POA: Diagnosis not present

## 2017-09-13 DIAGNOSIS — Z124 Encounter for screening for malignant neoplasm of cervix: Secondary | ICD-10-CM | POA: Diagnosis not present

## 2017-10-15 IMAGING — US US MFM OB TRANSVAGINAL
1 series · 13 of 28 positions shown · non-contrast
Comparison: none

[Series 1: us mfm ob transvaginal · 88 acquisitions, 13 frames shown]
[im 4/88]
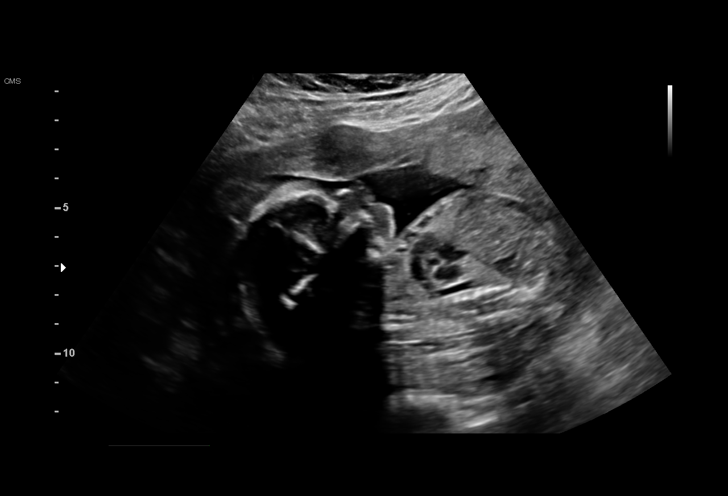
[im 10/88]
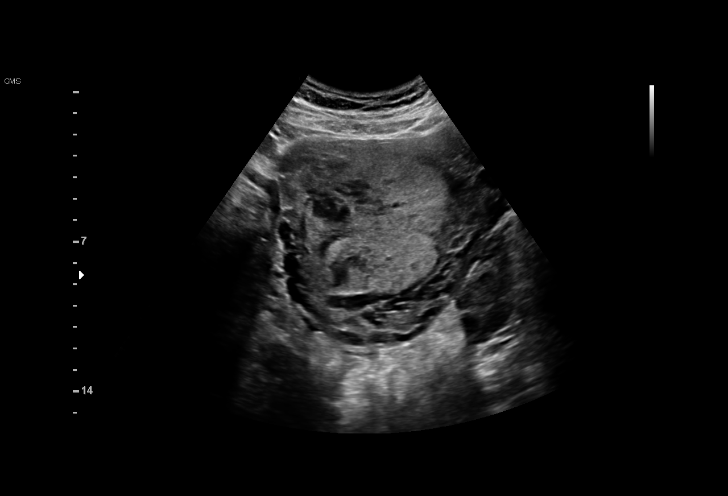
[im 17/88]
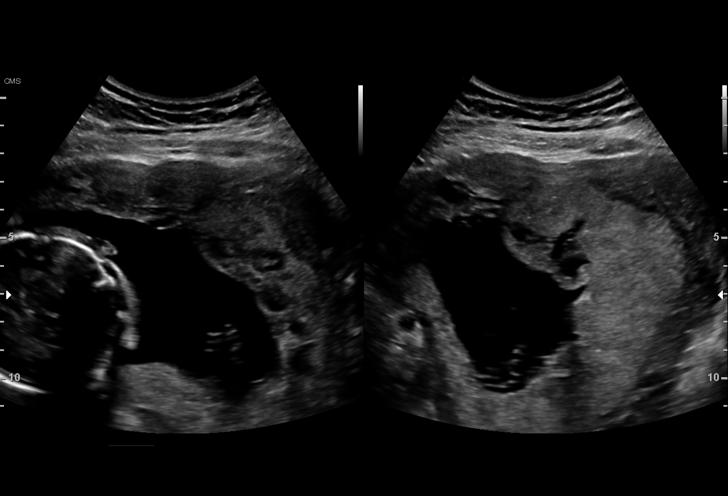
[im 23/88]
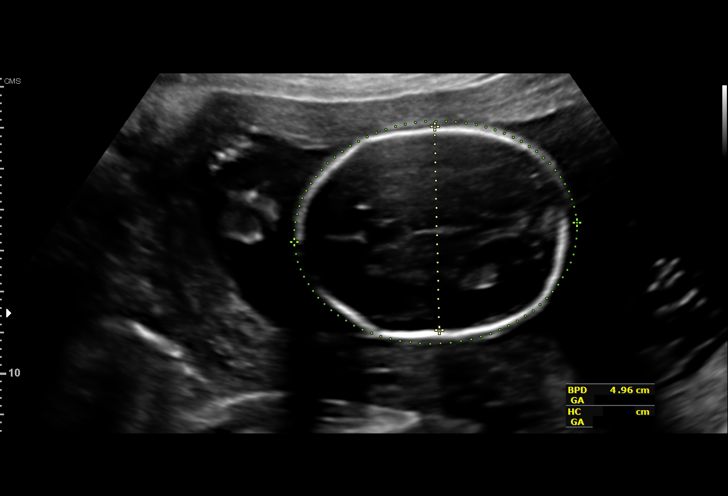
[im 30/88]
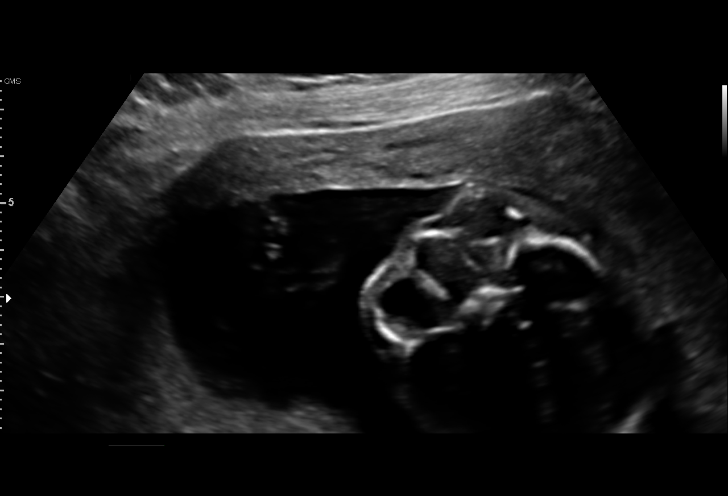
[im 36/88]
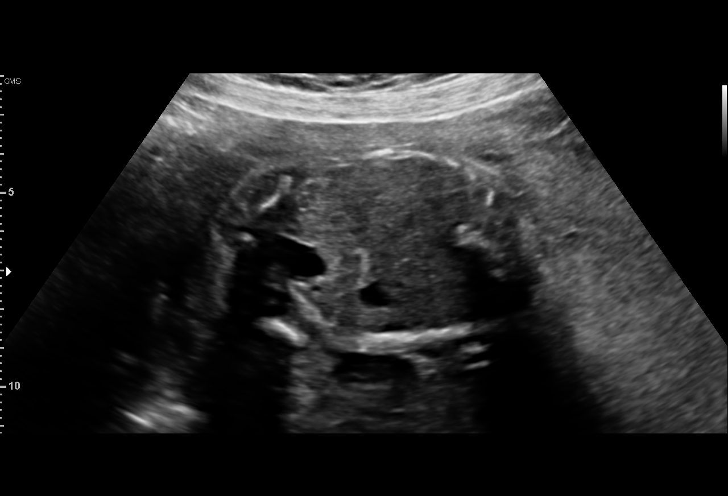
[im 46/88]
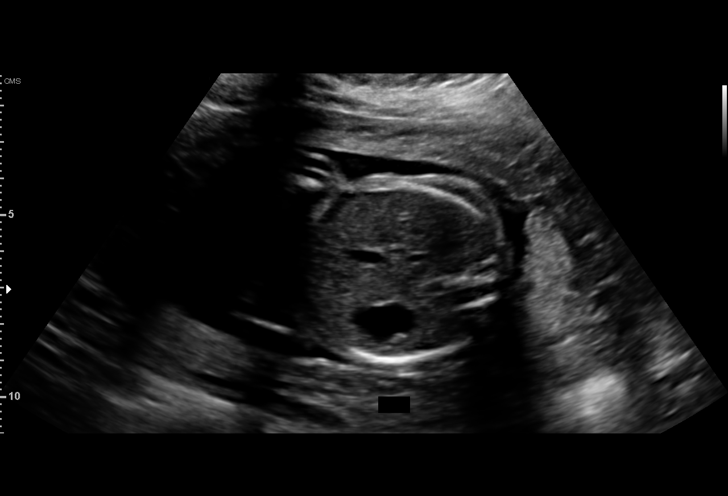
[im 52/88]
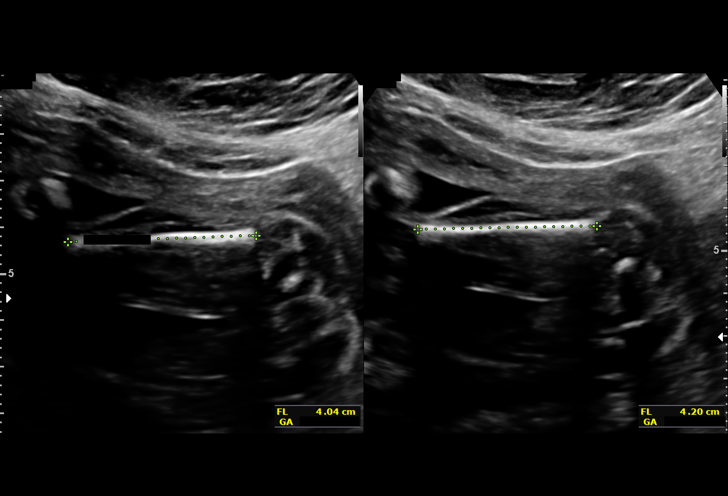
[im 59/88]
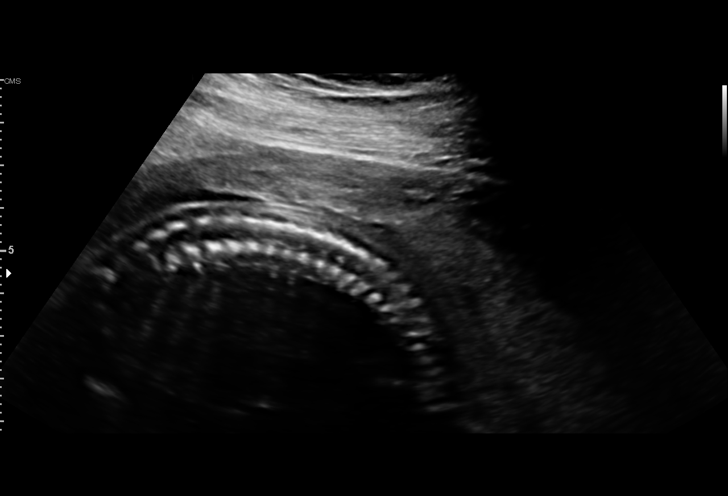
[im 65/88]
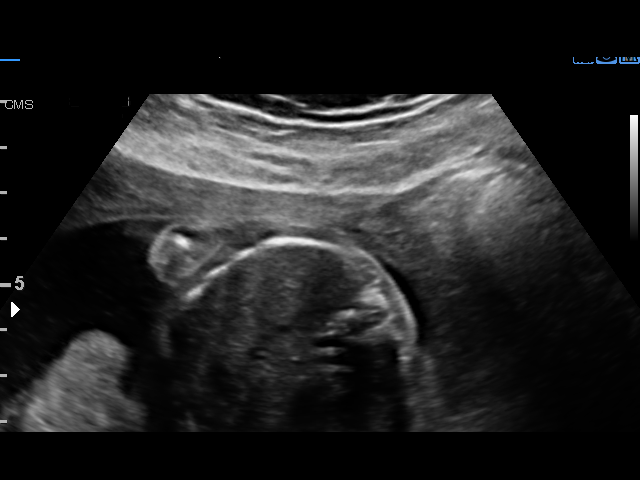
[im 71/88]
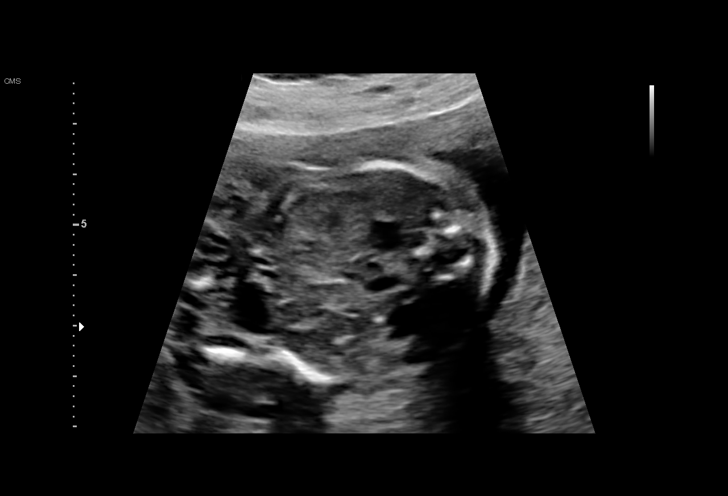
[im 78/88]
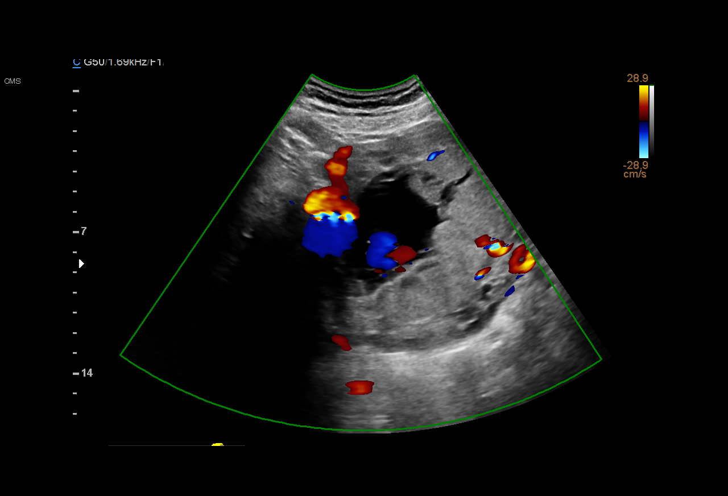
[im 84/88]
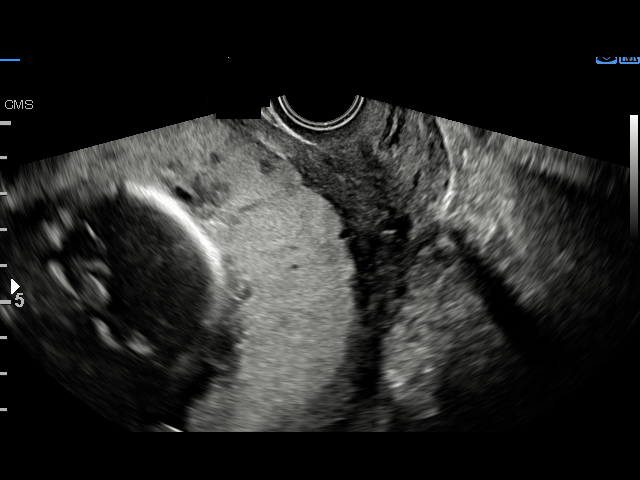

[13 of 28 positions shown; findings below may reference images not displayed]

OBGYN

1  SIU MAY NESPEROS             [id number]      [id number]     [id number]
2  CHABELO ARGUMEDO            [id number]      [id number]     [id number]
Indications

22 weeks gestation of pregnancy
Antenatal screening for malformations
Placenta previa specified as without
hemorrhage, second trimester
Pregnancy resulting from assisted
reproductive technology
Uterine fibroids affecting pregnancy in        O34.12,
second trimester, antepartum
OB History

Blood Type:            Height:  5'7"   Weight (lb):  171       BMI:
Gravidity:    1
Fetal Evaluation

Num Of Fetuses:     1
Fetal Heart         157
Rate(bpm):
Cardiac Activity:   Observed
Presentation:       Variable
Placenta:           Central previa

Amniotic Fluid
AFI FV:      Subjectively within normal limits
Largest Pocket(cm)
3.91
Biometry

BPD:      50.4  mm     G. Age:  21w 2d         10  %    CI:        66.96   %    70 - 86
FL/HC:      20.9   %    18.4 -
HC:      197.3  mm     G. Age:  22w 0d         19  %    HC/AC:      1.08        1.06 -
AC:       183   mm     G. Age:  23w 1d         64  %    FL/BPD:     81.7   %    71 - 87
FL:       41.2  mm     G. Age:  23w 3d         71  %    FL/AC:      22.5   %    20 - 24
HUM:      37.9  mm     G. Age:  23w 2d         68  %
CER:      23.6  mm     G. Age:  21w 6d         38  %
CM:        5.2  mm

Est. FW:     555  gm      1 lb 4 oz     59  %
Gestational Age

U/S Today:     22w 3d                                        EDD:   06/15/16
Best:          22w 3d     Det. By:  Embryo Transfer          EDD:   06/15/16
Anatomy

Cranium:               Appears normal         Aortic Arch:            Not well visualized
Cavum:                 Appears normal         Ductal Arch:            Not well visualized
Ventricles:            Appears normal         Diaphragm:              Appears normal
Choroid Plexus:        Appears normal         Stomach:                Appears normal, left
sided
Cerebellum:            Appears normal         Abdomen:                Appears normal
Posterior Fossa:       Appears normal         Abdominal Wall:         Appears nml (cord
insert, abd wall)
Nuchal Fold:           Not applicable (>20    Cord Vessels:           Appears normal (3
wks GA)                                        vessel cord)
Face:                  Appears normal         Kidneys:                Bilateral UTD
(orbits and profile)
Lips:                  Appears normal         Bladder:                Appears normal
Thoracic:              Appears normal         Spine:                  Appears normal
Heart:                 Appears normal         Upper Extremities:      Appears normal
(4CH, axis, and
situs)
RVOT:                  Not well visualized    Lower Extremities:      Appears normal
LVOT:                  Appears normal

Other:  Female gender. Nasal bone visualized. Open hands visualized.
Technically difficult due to fetal position.
Cervix Uterus Adnexa

Cervix
Length:            3.1  cm.
Normal appearance by transvaginal scan

Uterus
No abnormality visualized.

Left Ovary
Within normal limits.

Right Ovary
Within normal limits.
Cul De Sac:   No free fluid seen.
Adnexa:       No abnormality visualized.
Myomas

Site                     L(cm)      W(cm)      D(cm)      Location
Anterior
Anterior

Blood Flow                 RI        PI       Comments

Impression

Single IUP at 22w 3d
Bilateral urinary tract dilation is noted.  Both the renal
pelvises measure approximately 7mm.  No calyceal dilation
noted (UTD A1)
Limited views of the fetal heart were obtained (RVOT)
Uterine myomas noted as described above
The estimated fetal weight is at the 59th %tile.
Normal amniotic fluid volume

TVUS: A complete / central placenta previa is noted.  No
obvious subchorionic fluid collections were noted.  There
appears to be a marginal placental cord insertion, but no
vessels appear to course along fetal membranes (no
unprotected fetal vessels)
Recommendations

See separate consult note

Recommend follow up ultrasound in 6 weeks to reevaluate
placental location and to reevaluate the fetal heart

## 2017-11-09 DIAGNOSIS — S93601A Unspecified sprain of right foot, initial encounter: Secondary | ICD-10-CM | POA: Diagnosis not present

## 2018-02-28 DIAGNOSIS — Z23 Encounter for immunization: Secondary | ICD-10-CM | POA: Diagnosis not present

## 2018-02-28 DIAGNOSIS — N9489 Other specified conditions associated with female genital organs and menstrual cycle: Secondary | ICD-10-CM | POA: Diagnosis not present

## 2018-02-28 DIAGNOSIS — Z113 Encounter for screening for infections with a predominantly sexual mode of transmission: Secondary | ICD-10-CM | POA: Diagnosis not present

## 2018-02-28 DIAGNOSIS — B958 Unspecified staphylococcus as the cause of diseases classified elsewhere: Secondary | ICD-10-CM | POA: Diagnosis not present

## 2018-03-13 DIAGNOSIS — L0291 Cutaneous abscess, unspecified: Secondary | ICD-10-CM | POA: Diagnosis not present

## 2018-03-13 DIAGNOSIS — L02216 Cutaneous abscess of umbilicus: Secondary | ICD-10-CM | POA: Diagnosis not present

## 2018-03-13 DIAGNOSIS — Z6829 Body mass index (BMI) 29.0-29.9, adult: Secondary | ICD-10-CM | POA: Diagnosis not present

## 2018-03-20 DIAGNOSIS — Q644 Malformation of urachus: Secondary | ICD-10-CM | POA: Diagnosis not present

## 2018-03-30 DIAGNOSIS — L988 Other specified disorders of the skin and subcutaneous tissue: Secondary | ICD-10-CM | POA: Diagnosis not present

## 2018-03-30 DIAGNOSIS — L905 Scar conditions and fibrosis of skin: Secondary | ICD-10-CM | POA: Diagnosis not present

## 2018-03-30 DIAGNOSIS — Q644 Malformation of urachus: Secondary | ICD-10-CM | POA: Diagnosis not present

## 2018-04-13 DIAGNOSIS — L710 Perioral dermatitis: Secondary | ICD-10-CM | POA: Diagnosis not present

## 2018-04-20 DIAGNOSIS — F4323 Adjustment disorder with mixed anxiety and depressed mood: Secondary | ICD-10-CM | POA: Diagnosis not present

## 2018-04-21 DIAGNOSIS — J029 Acute pharyngitis, unspecified: Secondary | ICD-10-CM | POA: Diagnosis not present

## 2018-04-26 DIAGNOSIS — F4323 Adjustment disorder with mixed anxiety and depressed mood: Secondary | ICD-10-CM | POA: Diagnosis not present

## 2018-04-27 DIAGNOSIS — F4323 Adjustment disorder with mixed anxiety and depressed mood: Secondary | ICD-10-CM | POA: Diagnosis not present

## 2018-05-18 DIAGNOSIS — F4323 Adjustment disorder with mixed anxiety and depressed mood: Secondary | ICD-10-CM | POA: Diagnosis not present

## 2018-06-08 DIAGNOSIS — O3680X9 Pregnancy with inconclusive fetal viability, other fetus: Secondary | ICD-10-CM | POA: Diagnosis not present

## 2018-06-22 DIAGNOSIS — R5383 Other fatigue: Secondary | ICD-10-CM | POA: Diagnosis not present

## 2018-06-22 DIAGNOSIS — R635 Abnormal weight gain: Secondary | ICD-10-CM | POA: Diagnosis not present

## 2018-06-22 DIAGNOSIS — F331 Major depressive disorder, recurrent, moderate: Secondary | ICD-10-CM | POA: Diagnosis not present

## 2018-06-22 DIAGNOSIS — R05 Cough: Secondary | ICD-10-CM | POA: Diagnosis not present

## 2018-06-26 ENCOUNTER — Other Ambulatory Visit: Payer: Self-pay | Admitting: Internal Medicine

## 2018-06-26 DIAGNOSIS — R9389 Abnormal findings on diagnostic imaging of other specified body structures: Secondary | ICD-10-CM

## 2018-06-29 ENCOUNTER — Other Ambulatory Visit: Payer: Self-pay

## 2018-07-03 DIAGNOSIS — F4323 Adjustment disorder with mixed anxiety and depressed mood: Secondary | ICD-10-CM | POA: Diagnosis not present

## 2018-07-21 DIAGNOSIS — F4323 Adjustment disorder with mixed anxiety and depressed mood: Secondary | ICD-10-CM | POA: Diagnosis not present

## 2018-07-31 DIAGNOSIS — F4323 Adjustment disorder with mixed anxiety and depressed mood: Secondary | ICD-10-CM | POA: Diagnosis not present

## 2018-08-10 DIAGNOSIS — F4323 Adjustment disorder with mixed anxiety and depressed mood: Secondary | ICD-10-CM | POA: Diagnosis not present

## 2018-08-17 DIAGNOSIS — F4323 Adjustment disorder with mixed anxiety and depressed mood: Secondary | ICD-10-CM | POA: Diagnosis not present

## 2018-09-13 DIAGNOSIS — R7989 Other specified abnormal findings of blood chemistry: Secondary | ICD-10-CM | POA: Diagnosis not present

## 2018-09-13 DIAGNOSIS — R5383 Other fatigue: Secondary | ICD-10-CM | POA: Diagnosis not present

## 2018-09-13 DIAGNOSIS — R82998 Other abnormal findings in urine: Secondary | ICD-10-CM | POA: Diagnosis not present

## 2018-09-13 DIAGNOSIS — R635 Abnormal weight gain: Secondary | ICD-10-CM | POA: Diagnosis not present

## 2018-09-13 DIAGNOSIS — Z0000 Encounter for general adult medical examination without abnormal findings: Secondary | ICD-10-CM | POA: Diagnosis not present

## 2018-09-20 DIAGNOSIS — R635 Abnormal weight gain: Secondary | ICD-10-CM | POA: Diagnosis not present

## 2018-09-20 DIAGNOSIS — Z1331 Encounter for screening for depression: Secondary | ICD-10-CM | POA: Diagnosis not present

## 2018-09-20 DIAGNOSIS — F419 Anxiety disorder, unspecified: Secondary | ICD-10-CM | POA: Diagnosis not present

## 2018-09-20 DIAGNOSIS — F330 Major depressive disorder, recurrent, mild: Secondary | ICD-10-CM | POA: Diagnosis not present

## 2018-09-20 DIAGNOSIS — Z0000 Encounter for general adult medical examination without abnormal findings: Secondary | ICD-10-CM | POA: Diagnosis not present

## 2018-09-20 DIAGNOSIS — R030 Elevated blood-pressure reading, without diagnosis of hypertension: Secondary | ICD-10-CM | POA: Diagnosis not present

## 2018-10-26 DIAGNOSIS — Z20828 Contact with and (suspected) exposure to other viral communicable diseases: Secondary | ICD-10-CM | POA: Diagnosis not present

## 2019-01-08 DIAGNOSIS — Z3189 Encounter for other procreative management: Secondary | ICD-10-CM | POA: Diagnosis not present

## 2019-02-09 DIAGNOSIS — F3340 Major depressive disorder, recurrent, in remission, unspecified: Secondary | ICD-10-CM | POA: Diagnosis not present

## 2019-02-09 DIAGNOSIS — R030 Elevated blood-pressure reading, without diagnosis of hypertension: Secondary | ICD-10-CM | POA: Diagnosis not present

## 2019-02-12 DIAGNOSIS — Z713 Dietary counseling and surveillance: Secondary | ICD-10-CM | POA: Diagnosis not present

## 2019-02-27 DIAGNOSIS — M545 Low back pain: Secondary | ICD-10-CM | POA: Diagnosis not present

## 2019-02-28 DIAGNOSIS — M47816 Spondylosis without myelopathy or radiculopathy, lumbar region: Secondary | ICD-10-CM | POA: Diagnosis not present

## 2019-03-07 DIAGNOSIS — M47816 Spondylosis without myelopathy or radiculopathy, lumbar region: Secondary | ICD-10-CM | POA: Diagnosis not present

## 2019-03-13 DIAGNOSIS — G4700 Insomnia, unspecified: Secondary | ICD-10-CM | POA: Diagnosis not present

## 2019-03-13 DIAGNOSIS — M549 Dorsalgia, unspecified: Secondary | ICD-10-CM | POA: Diagnosis not present

## 2019-03-13 DIAGNOSIS — R030 Elevated blood-pressure reading, without diagnosis of hypertension: Secondary | ICD-10-CM | POA: Diagnosis not present

## 2019-06-25 DIAGNOSIS — N8500 Endometrial hyperplasia, unspecified: Secondary | ICD-10-CM | POA: Diagnosis not present

## 2019-06-25 DIAGNOSIS — Z3141 Encounter for fertility testing: Secondary | ICD-10-CM | POA: Diagnosis not present

## 2019-07-23 DIAGNOSIS — Z113 Encounter for screening for infections with a predominantly sexual mode of transmission: Secondary | ICD-10-CM | POA: Diagnosis not present

## 2019-08-08 DIAGNOSIS — N978 Female infertility of other origin: Secondary | ICD-10-CM | POA: Diagnosis not present

## 2019-08-08 DIAGNOSIS — Z113 Encounter for screening for infections with a predominantly sexual mode of transmission: Secondary | ICD-10-CM | POA: Diagnosis not present

## 2019-08-13 DIAGNOSIS — Z3200 Encounter for pregnancy test, result unknown: Secondary | ICD-10-CM | POA: Diagnosis not present

## 2019-08-13 DIAGNOSIS — Z3201 Encounter for pregnancy test, result positive: Secondary | ICD-10-CM | POA: Diagnosis not present

## 2019-08-21 DIAGNOSIS — Z3200 Encounter for pregnancy test, result unknown: Secondary | ICD-10-CM | POA: Diagnosis not present

## 2019-09-04 DIAGNOSIS — Z3200 Encounter for pregnancy test, result unknown: Secondary | ICD-10-CM | POA: Diagnosis not present

## 2019-09-18 DIAGNOSIS — Z0000 Encounter for general adult medical examination without abnormal findings: Secondary | ICD-10-CM | POA: Diagnosis not present

## 2019-09-18 DIAGNOSIS — R5383 Other fatigue: Secondary | ICD-10-CM | POA: Diagnosis not present

## 2019-09-21 DIAGNOSIS — R82998 Other abnormal findings in urine: Secondary | ICD-10-CM | POA: Diagnosis not present

## 2019-09-21 DIAGNOSIS — N913 Primary oligomenorrhea: Secondary | ICD-10-CM | POA: Diagnosis not present

## 2019-09-21 DIAGNOSIS — N925 Other specified irregular menstruation: Secondary | ICD-10-CM | POA: Diagnosis not present

## 2019-09-21 DIAGNOSIS — Z01419 Encounter for gynecological examination (general) (routine) without abnormal findings: Secondary | ICD-10-CM | POA: Diagnosis not present

## 2019-09-21 DIAGNOSIS — Z124 Encounter for screening for malignant neoplasm of cervix: Secondary | ICD-10-CM | POA: Diagnosis not present

## 2019-09-21 DIAGNOSIS — Z113 Encounter for screening for infections with a predominantly sexual mode of transmission: Secondary | ICD-10-CM | POA: Diagnosis not present

## 2019-09-24 DIAGNOSIS — E7849 Other hyperlipidemia: Secondary | ICD-10-CM | POA: Diagnosis not present

## 2019-09-25 DIAGNOSIS — G4700 Insomnia, unspecified: Secondary | ICD-10-CM | POA: Diagnosis not present

## 2019-09-25 DIAGNOSIS — R030 Elevated blood-pressure reading, without diagnosis of hypertension: Secondary | ICD-10-CM | POA: Diagnosis not present

## 2019-09-25 DIAGNOSIS — Z1331 Encounter for screening for depression: Secondary | ICD-10-CM | POA: Diagnosis not present

## 2019-09-25 DIAGNOSIS — F419 Anxiety disorder, unspecified: Secondary | ICD-10-CM | POA: Diagnosis not present

## 2019-09-25 DIAGNOSIS — Z0000 Encounter for general adult medical examination without abnormal findings: Secondary | ICD-10-CM | POA: Diagnosis not present

## 2019-09-25 DIAGNOSIS — F3340 Major depressive disorder, recurrent, in remission, unspecified: Secondary | ICD-10-CM | POA: Diagnosis not present

## 2019-10-02 DIAGNOSIS — Z3491 Encounter for supervision of normal pregnancy, unspecified, first trimester: Secondary | ICD-10-CM | POA: Diagnosis not present

## 2019-10-02 DIAGNOSIS — Z3A11 11 weeks gestation of pregnancy: Secondary | ICD-10-CM | POA: Diagnosis not present

## 2019-11-21 DIAGNOSIS — Z363 Encounter for antenatal screening for malformations: Secondary | ICD-10-CM | POA: Diagnosis not present

## 2019-11-21 DIAGNOSIS — Z3A19 19 weeks gestation of pregnancy: Secondary | ICD-10-CM | POA: Diagnosis not present

## 2019-11-21 DIAGNOSIS — O09522 Supervision of elderly multigravida, second trimester: Secondary | ICD-10-CM | POA: Diagnosis not present

## 2019-11-21 DIAGNOSIS — Z3686 Encounter for antenatal screening for cervical length: Secondary | ICD-10-CM | POA: Diagnosis not present

## 2019-12-12 DIAGNOSIS — Z3A22 22 weeks gestation of pregnancy: Secondary | ICD-10-CM | POA: Diagnosis not present

## 2019-12-12 DIAGNOSIS — Z362 Encounter for other antenatal screening follow-up: Secondary | ICD-10-CM | POA: Diagnosis not present

## 2019-12-14 DIAGNOSIS — L570 Actinic keratosis: Secondary | ICD-10-CM | POA: Diagnosis not present

## 2019-12-14 DIAGNOSIS — L304 Erythema intertrigo: Secondary | ICD-10-CM | POA: Diagnosis not present

## 2019-12-14 DIAGNOSIS — L573 Poikiloderma of Civatte: Secondary | ICD-10-CM | POA: Diagnosis not present

## 2020-01-24 DIAGNOSIS — Z3A28 28 weeks gestation of pregnancy: Secondary | ICD-10-CM | POA: Diagnosis not present

## 2020-01-24 DIAGNOSIS — O3413 Maternal care for benign tumor of corpus uteri, third trimester: Secondary | ICD-10-CM | POA: Diagnosis not present

## 2020-01-24 DIAGNOSIS — Z3493 Encounter for supervision of normal pregnancy, unspecified, third trimester: Secondary | ICD-10-CM | POA: Diagnosis not present

## 2020-01-24 DIAGNOSIS — D259 Leiomyoma of uterus, unspecified: Secondary | ICD-10-CM | POA: Diagnosis not present

## 2020-02-03 ENCOUNTER — Encounter (HOSPITAL_COMMUNITY): Payer: Self-pay | Admitting: Obstetrics & Gynecology

## 2020-02-03 ENCOUNTER — Inpatient Hospital Stay (HOSPITAL_COMMUNITY)
Admission: AD | Admit: 2020-02-03 | Discharge: 2020-02-12 | DRG: 788 | Disposition: A | Discharge status: Home / Self Care | Payer: BLUE CROSS/BLUE SHIELD | Attending: Obstetrics & Gynecology | Admitting: Obstetrics & Gynecology

## 2020-02-03 ENCOUNTER — Other Ambulatory Visit: Payer: Self-pay

## 2020-02-03 DIAGNOSIS — O1493 Unspecified pre-eclampsia, third trimester: Secondary | ICD-10-CM | POA: Diagnosis not present

## 2020-02-03 DIAGNOSIS — Z20822 Contact with and (suspected) exposure to covid-19: Secondary | ICD-10-CM | POA: Diagnosis not present

## 2020-02-03 DIAGNOSIS — Z3A30 30 weeks gestation of pregnancy: Secondary | ICD-10-CM | POA: Diagnosis not present

## 2020-02-03 DIAGNOSIS — O1413 Severe pre-eclampsia, third trimester: Secondary | ICD-10-CM

## 2020-02-03 DIAGNOSIS — Z3A29 29 weeks gestation of pregnancy: Secondary | ICD-10-CM

## 2020-02-03 DIAGNOSIS — O152 Eclampsia in the puerperium: Secondary | ICD-10-CM | POA: Diagnosis not present

## 2020-02-03 DIAGNOSIS — O3413 Maternal care for benign tumor of corpus uteri, third trimester: Secondary | ICD-10-CM | POA: Diagnosis not present

## 2020-02-03 DIAGNOSIS — Z23 Encounter for immunization: Secondary | ICD-10-CM | POA: Diagnosis not present

## 2020-02-03 DIAGNOSIS — O34219 Maternal care for unspecified type scar from previous cesarean delivery: Secondary | ICD-10-CM | POA: Diagnosis not present

## 2020-02-03 DIAGNOSIS — O1414 Severe pre-eclampsia complicating childbirth: Principal | ICD-10-CM | POA: Diagnosis present

## 2020-02-03 DIAGNOSIS — O09293 Supervision of pregnancy with other poor reproductive or obstetric history, third trimester: Secondary | ICD-10-CM | POA: Diagnosis not present

## 2020-02-03 DIAGNOSIS — O1400 Mild to moderate pre-eclampsia, unspecified trimester: Secondary | ICD-10-CM | POA: Diagnosis not present

## 2020-02-03 DIAGNOSIS — O34211 Maternal care for low transverse scar from previous cesarean delivery: Secondary | ICD-10-CM | POA: Diagnosis not present

## 2020-02-03 DIAGNOSIS — Z8659 Personal history of other mental and behavioral disorders: Secondary | ICD-10-CM

## 2020-02-03 DIAGNOSIS — R030 Elevated blood-pressure reading, without diagnosis of hypertension: Secondary | ICD-10-CM | POA: Diagnosis not present

## 2020-02-03 DIAGNOSIS — O1490 Unspecified pre-eclampsia, unspecified trimester: Secondary | ICD-10-CM

## 2020-02-03 DIAGNOSIS — Z98891 History of uterine scar from previous surgery: Secondary | ICD-10-CM

## 2020-02-03 LAB — COMPREHENSIVE METABOLIC PANEL
ALT: 20 U/L (ref 0–44)
AST: 22 U/L (ref 15–41)
Albumin: 2.8 g/dL — ABNORMAL LOW (ref 3.5–5.0)
Alkaline Phosphatase: 51 U/L (ref 38–126)
Anion gap: 10 (ref 5–15)
BUN: 9 mg/dL (ref 6–20)
CO2: 19 mmol/L — ABNORMAL LOW (ref 22–32)
Calcium: 9.9 mg/dL (ref 8.9–10.3)
Chloride: 108 mmol/L (ref 98–111)
Creatinine, Ser: 0.69 mg/dL (ref 0.44–1.00)
GFR calc Af Amer: >60 mL/min (ref >60)
GFR calc non Af Amer: >60 mL/min (ref >60)
Glucose, Bld: 79 mg/dL (ref 70–99)
Potassium: 4 mmol/L (ref 3.5–5.1)
Sodium: 137 mmol/L (ref 135–145)
Total Bilirubin: 0.5 mg/dL (ref 0.3–1.2)
Total Protein: 6.2 g/dL — ABNORMAL LOW (ref 6.5–8.1)

## 2020-02-03 LAB — URINALYSIS, ROUTINE W REFLEX MICROSCOPIC
Bilirubin Urine: NEGATIVE
Glucose, UA: NEGATIVE mg/dL
Hgb urine dipstick: NEGATIVE
Ketones, ur: NEGATIVE mg/dL
Leukocytes,Ua: NEGATIVE
Nitrite: NEGATIVE
Protein, ur: 100 mg/dL — AB
Specific Gravity, Urine: 1.027 (ref 1.005–1.030)
pH: 5 (ref 5.0–8.0)

## 2020-02-03 LAB — CBC
HCT: 38.4 % (ref 36.0–46.0)
Hemoglobin: 12.5 g/dL (ref 12.0–15.0)
MCH: 29.5 pg (ref 26.0–34.0)
MCHC: 32.6 g/dL (ref 30.0–36.0)
MCV: 90.6 fL (ref 80.0–100.0)
Platelets: 196 10*3/uL (ref 150–400)
RBC: 4.24 MIL/uL (ref 3.87–5.11)
RDW: 12.9 % (ref 11.5–15.5)
WBC: 8.9 10*3/uL (ref 4.0–10.5)
nRBC: 0 % (ref 0.0–0.2)

## 2020-02-03 LAB — TYPE AND SCREEN
ABO/RH(D): O POS
Antibody Screen: NEGATIVE

## 2020-02-03 LAB — PROTEIN / CREATININE RATIO, URINE
Creatinine, Urine: 229.35 mg/dL
Protein Creatinine Ratio: 0.55 mg/mg{Cre} — ABNORMAL HIGH (ref 0.00–0.15)
Total Protein, Urine: 127 mg/dL

## 2020-02-03 LAB — RESPIRATORY PANEL BY RT PCR (FLU A&B, COVID)
Influenza A by PCR: NEGATIVE
Influenza B by PCR: NEGATIVE
SARS Coronavirus 2 by RT PCR: NEGATIVE

## 2020-02-03 MED ORDER — LABETALOL HCL 5 MG/ML IV SOLN
40.0000 mg | INTRAVENOUS | Status: DC | PRN
  Filled 2020-02-03: qty 8

## 2020-02-03 MED ORDER — ZOLPIDEM TARTRATE 5 MG PO TABS
5.0000 mg | ORAL_TABLET | Freq: Every evening | ORAL | Status: DC | PRN
  Administered 2020-02-03 – 2020-02-07 (×5): 5 mg via ORAL
  Filled 2020-02-03 (×5): qty 1

## 2020-02-03 MED ORDER — LABETALOL HCL 5 MG/ML IV SOLN: 80.0000 mg | INTRAVENOUS | Status: DC | PRN

## 2020-02-03 MED ORDER — LACTATED RINGERS IV SOLN: INTRAVENOUS | Status: DC

## 2020-02-03 MED ORDER — PRENATAL MULTIVITAMIN CH
1.0000 | ORAL_TABLET | Freq: Every day | ORAL | Status: DC
  Administered 2020-02-04 – 2020-02-08 (×5): 1 via ORAL
  Filled 2020-02-03 (×5): qty 1

## 2020-02-03 MED ORDER — INFLUENZA VAC SPLIT QUAD 0.5 ML IM SUSY
0.5000 mL | PREFILLED_SYRINGE | INTRAMUSCULAR | Status: AC
  Administered 2020-02-05: 0.5 mL via INTRAMUSCULAR
  Filled 2020-02-03: qty 0.5

## 2020-02-03 MED ORDER — LABETALOL HCL 5 MG/ML IV SOLN
20.0000 mg | INTRAVENOUS | Status: DC | PRN
  Administered 2020-02-07 – 2020-02-08 (×2): 20 mg via INTRAVENOUS

## 2020-02-03 MED ORDER — MAGNESIUM SULFATE 40 GM/1000ML IV SOLN
2.0000 g/h | INTRAVENOUS | Status: AC
  Administered 2020-02-03 – 2020-02-05 (×3): 2 g/h via INTRAVENOUS
  Filled 2020-02-03 (×3): qty 1000

## 2020-02-03 MED ORDER — MAGNESIUM SULFATE BOLUS VIA INFUSION
4.0000 g | Freq: Once | INTRAVENOUS | Status: AC
Start: 2020-02-03 — End: 2020-02-03
  Administered 2020-02-03: 18:00:00 4 g via INTRAVENOUS
  Filled 2020-02-03: qty 1000

## 2020-02-03 MED ORDER — HYDRALAZINE HCL 20 MG/ML IJ SOLN
10.0000 mg | INTRAMUSCULAR | Status: DC | PRN
  Administered 2020-02-08: 21:00:00 10 mg via INTRAVENOUS

## 2020-02-03 MED ORDER — LABETALOL HCL 5 MG/ML IV SOLN
20.0000 mg | INTRAVENOUS | Status: DC | PRN
  Administered 2020-02-03: 17:00:00 20 mg via INTRAVENOUS
  Filled 2020-02-03 (×3): qty 4

## 2020-02-03 MED ORDER — DOCUSATE SODIUM 100 MG PO CAPS
100.0000 mg | ORAL_CAPSULE | Freq: Every day | ORAL | Status: DC
  Administered 2020-02-03 – 2020-02-08 (×5): 100 mg via ORAL
  Filled 2020-02-03 (×5): qty 1

## 2020-02-03 MED ORDER — LABETALOL HCL 5 MG/ML IV SOLN: 40.0000 mg | INTRAVENOUS | Status: DC | PRN

## 2020-02-03 MED ORDER — BETAMETHASONE SOD PHOS & ACET 6 (3-3) MG/ML IJ SUSP
12.0000 mg | INTRAMUSCULAR | Status: AC
  Administered 2020-02-03 – 2020-02-04 (×2): 12 mg via INTRAMUSCULAR
  Filled 2020-02-03: qty 5

## 2020-02-03 MED ORDER — ACETAMINOPHEN 325 MG PO TABS
650.0000 mg | ORAL_TABLET | ORAL | Status: DC | PRN
  Administered 2020-02-03 – 2020-02-06 (×3): 650 mg via ORAL
  Filled 2020-02-03 (×3): qty 2

## 2020-02-03 MED ORDER — HYDRALAZINE HCL 20 MG/ML IJ SOLN
10.0000 mg | INTRAMUSCULAR | Status: DC | PRN
  Filled 2020-02-03: qty 1

## 2020-02-03 MED ORDER — CALCIUM CARBONATE ANTACID 500 MG PO CHEW: 2.0000 | CHEWABLE_TABLET | ORAL | Status: DC | PRN

## 2020-02-03 MED ORDER — LABETALOL HCL 200 MG PO TABS
200.0000 mg | ORAL_TABLET | Freq: Two times a day (BID) | ORAL | Status: DC
  Administered 2020-02-03 – 2020-02-06 (×5): 200 mg via ORAL
  Filled 2020-02-03 (×6): qty 1

## 2020-02-03 NOTE — Consult Note (Signed)
Neonatology Consult to Antenatal Patient:  I was asked by Dr. Alwyn Pea to see this patient in order to provide antenatal counseling due to prematurity.  Sarah Berry was admitted 02/03/20  at 29 3/[redacted] weeks GA for pre-eclampsia. She is currently not having active labor and membranes are intact. She is getting BMZ and IV Magnesium.  This is an IVF pregancy compicated otherwise by placental previa.  Prior IVF pregnancy by her report resulted in inpatient bedrest starting at 23 weeks until delivery at 36 weeks about 3.5 years ago.  Hopeful for expectant management until [redacted] weeks EGA.   I spoke with the patient initially then concurrently with Dr. Alwyn Pea. We discussed the worst case of delivery in the next 1-2 days, including usual DR management, possible respiratory complications and need for support, IV (umbilical) access, feedings (mother desires breast feeding, which was encouraged, and agrees to donor milk usage), LOS, Mortality and Morbidity, and long term outcomes. She did have any questions at this time which were mostly answered. She did ask if baby could be tested for Covid antibodies (mother/family has been vaccinated) per their request.  I explained SARS-CoV-2 IgG is possible but that it would not likely be deemed medically indicated.  Passive transfer in utero and via breast milk of maternal antibodies to SARS-CoV-2 have been reported in the literature after vaccination as well as asymptomatic and symptomatic infection during pregnancy.  Prolongation of effective immunization through continued breastfeeding is still to be established.  I/we would be glad to come back if she has more questions later.  Thank you for asking me to see this patient.  Sarah Sabal Katherina Mires, MD Neonatologist  The total length of face-to-face or floor/unit time for this encounter was 25 minutes. Counseling and/or coordination of care was 40 minutes of the above.

## 2020-02-03 NOTE — H&P (Signed)
Sarah Berry is a 34 y.o. female presenting to hospital for headache at home 7/10 and "not feeling well" She took her blood pressure at home and it was elevated 140's /100's.   Patient denies RUQ pain, no scotomata, no blurry vision.  She denies ctx, no leaking of fluid, no vaginal bleeding and reports good fetal movement.    OB History    Gravida  3   Para  1   Term      Preterm  1   AB  1   Living  1     SAB  1   TAB      Ectopic      Multiple  0   Live Births  1          Past Medical History:  Diagnosis Date  . Fibroid   . History of postpartum hypertension 2018  . Infertility, female, primary   . Newborn product of in vitro fertilization (IVF) pregnancy   . Placenta previa    Past Surgical History:  Procedure Laterality Date  . CESAREAN SECTION N/A 05/19/2016   Procedure: CESAREAN SECTION;  Surgeon: Sanjuana Kava, MD;  Location: [facility];  Service: Obstetrics;  Laterality: N/A;  . egg retrieval    . MYOMECTOMY    . WISDOM TOOTH EXTRACTION     Family History: family history includes Cancer in her paternal grandmother; Hypertension in her father and mother. Social History:  reports that she has never smoked. She has never used smokeless tobacco. She reports that she does not drink alcohol and does not use drugs.     Maternal Diabetes: Failed 1HR 157 - hasn't had 3 HR  Genetic Screening: Normal Maternal Ultrasounds/Referrals: Normal Fetal Ultrasounds or other Referrals:  None Maternal Substance Abuse:  No Significant Maternal Medications:  Meds include: Other:  Prenatal vitamins Significant Maternal Lab Results:  None Other Comments:  None  Review of Systems  Gastrointestinal: Positive for nausea.  Neurological: Positive for headaches.    As per HPI    Blood pressure (!) 154/98, pulse 93, temperature 98.4 F (36.9 C), temperature source Oral, resp. rate 16, height 5' 7.5" (1.715 m), weight 95.4 kg, SpO2 98 %, currently  breastfeeding. Maternal Exam:  Uterine Assessment: Contraction frequency is rare.   Abdomen: Patient reports no abdominal tenderness. Fundal height is 30 cm.   Estimated fetal weight is 1800 grams.   Fetal presentation: vertex  Introitus: Ferning test: not done.  Nitrazine test: not done. Amniotic fluid character: not assessed.     Fetal Exam Fetal Monitor Review: Baseline rate: 150's .  Variability: moderate (6-25 bpm).   Pattern: no decelerations and accelerations present.    Fetal State Assessment: Category I - tracings are normal.     Physical Exam Vitals and nursing note reviewed.  Constitutional:      Appearance: She is ill-appearing.  Abdominal:     Palpations: Abdomen is soft.     Tenderness: There is no abdominal tenderness.  Musculoskeletal:        General: Swelling present.  Neurological:     Mental Status: She is alert and oriented to person, place, and time.     Deep Tendon Reflexes: Reflexes normal.  Psychiatric:        Mood and Affect: Mood is anxious.     Prenatal labs: ABO, Rh:  O+ Antibody:  Negative Rubella:   Immune RPR:   NR HBsAg:   Neg HIV:   Neg GBS:   Unknown  Assessment/Plan: 34 year old at 29 weeks 4 days with preeclampsia with severe features manifested by  Headache and severe range blood pressures.  Will admit patient to antepartum service IV labetalol to get BP down and will switch to PO IV magnesium sulfate to prevent seizures.  Preeclampsia labs are pending.  Discussed with patient that we will start betamethasone for fetal lung maturity, if her blood pressures  Continue to be severe range, or if there are marked abnormalities in her blood work, will probably proceed with repeat cesarean section.    Sarah Berry 02/03/2020, 5:06 PM

## 2020-02-03 NOTE — MAU Note (Signed)
Sarah Berry is a 34 y.o. at [redacted]w[redacted]d here in MAU reporting: states she has been lethargic all day. Started having a headache and took some tylenol and that took the edge off but did not take pain away completely. BP at 139/93 and 149/90. No visual changes. No contractions, LOF, or vaginal bleeding. +FM  Onset of complaint: today  Pain score: 4/10  Vitals:   02/03/20 1622  BP: (!) 163/107  Pulse: 89  Resp: 16  Temp: 98.4 F (36.9 C)  SpO2: 98%     FHT: +FM  Lab orders placed from triage: UA

## 2020-02-04 DIAGNOSIS — O1413 Severe pre-eclampsia, third trimester: Secondary | ICD-10-CM

## 2020-02-04 DIAGNOSIS — Z3A29 29 weeks gestation of pregnancy: Secondary | ICD-10-CM

## 2020-02-04 DIAGNOSIS — O34219 Maternal care for unspecified type scar from previous cesarean delivery: Secondary | ICD-10-CM

## 2020-02-04 LAB — COMPREHENSIVE METABOLIC PANEL
ALT: 17 U/L (ref 0–44)
AST: 21 U/L (ref 15–41)
Albumin: 2.5 g/dL — ABNORMAL LOW (ref 3.5–5.0)
Alkaline Phosphatase: 41 U/L (ref 38–126)
Anion gap: 10 (ref 5–15)
BUN: 8 mg/dL (ref 6–20)
CO2: 18 mmol/L — ABNORMAL LOW (ref 22–32)
Calcium: 7.1 mg/dL — ABNORMAL LOW (ref 8.9–10.3)
Chloride: 104 mmol/L (ref 98–111)
Creatinine, Ser: 0.71 mg/dL (ref 0.44–1.00)
GFR calc Af Amer: >60 mL/min (ref >60)
GFR calc non Af Amer: >60 mL/min (ref >60)
Glucose, Bld: 149 mg/dL — ABNORMAL HIGH (ref 70–99)
Potassium: 4.2 mmol/L (ref 3.5–5.1)
Sodium: 132 mmol/L — ABNORMAL LOW (ref 135–145)
Total Bilirubin: 0.4 mg/dL (ref 0.3–1.2)
Total Protein: 5.6 g/dL — ABNORMAL LOW (ref 6.5–8.1)

## 2020-02-04 LAB — CBC WITH DIFFERENTIAL/PLATELET
Abs Immature Granulocytes: 0.15 10*3/uL — ABNORMAL HIGH (ref 0.00–0.07)
Basophils Absolute: 0 10*3/uL (ref 0.0–0.1)
Basophils Relative: 0 %
Eosinophils Absolute: 0 10*3/uL (ref 0.0–0.5)
Eosinophils Relative: 0 %
HCT: 32.5 % — ABNORMAL LOW (ref 36.0–46.0)
Hemoglobin: 10.5 g/dL — ABNORMAL LOW (ref 12.0–15.0)
Immature Granulocytes: 1 %
Lymphocytes Relative: 9 %
Lymphs Abs: 1.1 10*3/uL (ref 0.7–4.0)
MCH: 29.2 pg (ref 26.0–34.0)
MCHC: 32.3 g/dL (ref 30.0–36.0)
MCV: 90.3 fL (ref 80.0–100.0)
Monocytes Absolute: 0.7 10*3/uL (ref 0.1–1.0)
Monocytes Relative: 6 %
Neutro Abs: 10.1 10*3/uL — ABNORMAL HIGH (ref 1.7–7.7)
Neutrophils Relative %: 84 %
Platelets: 179 10*3/uL (ref 150–400)
RBC: 3.6 MIL/uL — ABNORMAL LOW (ref 3.87–5.11)
RDW: 13 % (ref 11.5–15.5)
WBC: 12 10*3/uL — ABNORMAL HIGH (ref 4.0–10.5)
nRBC: 0 % (ref 0.0–0.2)

## 2020-02-04 LAB — PROTEIN / CREATININE RATIO, URINE
Creatinine, Urine: 60.67 mg/dL
Protein Creatinine Ratio: 0.13 mg/mg{Cre} (ref 0.00–0.15)
Total Protein, Urine: 8 mg/dL

## 2020-02-04 MED ORDER — BUTALBITAL-APAP-CAFFEINE 50-325-40 MG PO TABS
1.0000 | ORAL_TABLET | Freq: Four times a day (QID) | ORAL | Status: DC | PRN
  Administered 2020-02-04 – 2020-02-05 (×3): 2 via ORAL
  Administered 2020-02-06 – 2020-02-07 (×3): 1 via ORAL
  Administered 2020-02-08: 2 via ORAL
  Filled 2020-02-04: qty 1
  Filled 2020-02-04 (×4): qty 2
  Filled 2020-02-04 (×2): qty 1

## 2020-02-04 MED ORDER — PANTOPRAZOLE SODIUM 40 MG PO TBEC
40.0000 mg | DELAYED_RELEASE_TABLET | Freq: Every day | ORAL | Status: DC
  Administered 2020-02-05 – 2020-02-08 (×4): 40 mg via ORAL
  Filled 2020-02-04 (×4): qty 1

## 2020-02-04 MED ORDER — PANTOPRAZOLE SODIUM 40 MG IV SOLR
40.0000 mg | INTRAVENOUS | Status: DC
  Administered 2020-02-04: 40 mg via INTRAVENOUS
  Filled 2020-02-04: qty 40

## 2020-02-04 NOTE — H&P (Addendum)
Maternal-Fetal Medicine   Name: Sarah Berry DOB: 22-Feb-1986 MRN: [id number] Referring Provider: Sanjuana Kava, MD  I had the pleasure of seeing Sarah Berry. She is  G3 P1011 at 29w 4d gestation admitted yesterday with c/o severe headache and increased blood pressures. Patient checked her blood pressures at home MAU and had patient blood pressures were 163/107 and 163/102 mmHg. Patient checked her blood pressures at home patient patient checked her blood pressures at home and they were 139/93 and 140/90 mmHg.   She was evaluated at the MAU and had patient blood pressures were 163/107 and 163/102 mmHg.  She has been having headaches (7/10). On admission, she received IV labetalol 20 mg to control her blood pressure and magnesium sulfate infusion was started.  She also received the first dose of betamethasone.  Labetalol 200 mg p.o. twice daily was also started.  Based on increased blood pressures and increased proteinuria, a diagnosis of preeclampsia with severe features was made. Review of systems: No headaches now, no blurry vision, no chest pain or palpitations.  No nausea, vomiting, diarrhea.  No abdominal or joint pains.  No history of recurrent urinary tract infections.  No vaginal bleeding.  Patient experiences good fetal movements.  Prenatal course: On cell free fetal DNA screening, the risks of fetal aneuploidies are not increased.  Midtrimester fetal anatomy scan performed at your office was reported as normal.  Patient reports she had abnormal 1 hour glucose challenge test and she was scheduled to have 3-hour gtt. this week.  Past medical history: No history of diabetes.  She has been having intermittent hypertension since delivery of her first child.  She was not taking antihypertensives.  No history of thyroid disorders or any other chronic medical conditions. Past surgical history: Cesarean delivery, laparoscopic myomectomy. Medications: Prenatal vitamins, labetalol,  betamethasone. Allergies: Bactrim (vomiting and photophobia). Social history: Denies tobacco drug or alcohol use.  Her husband is in good health. Family history no history of venous thromboembolism in the family. GYN history: No history of abnormal Pap smears or cervical surgeries.  No history of breast disease. Obstetric history: In 2018, she had a cesarean delivery at [redacted] weeks gestation (placenta previa) of a female infant weighing 2,375 g at birth.  Her daughter is in good health. At day 12 postpartum, she had increased blood pressures that confirmed the diagnosis of postpartum preeclampsia.  She was admitted for 2 days and then discharged.  Patient reports that she has been having intermittent hypertension since her first delivery but never required antihypertensives.  P/E: Patient is comfortably lying in bed; not in distress. BP: 98-163/65-107 mm Hg; afebrile HEENT: Normal. No evidence of lymphadenopathy. Abdomen: Soft, gravid uterus; no tenderness. Bilateral minimal pedal edema present. Reflexes normal. NST reactive.  Labs: Hemoglobin 10.2, hematocrit 31.6, WBC 7.4, platelets 346, creatinine 0.69, AST 22, ALT 20, protein/creatinine ratio 0.55, blood type O positive.  Our concerns include: Preeclampsia with severe features -I explained the diagnosis based on increased blood pressures and increased proteinuria.  Severe features (systolic blood pressures greater than 160 and severe headache) blood present. -I discussed the possible maternal complications.  Maternal complications include stroke, eclampsia, pulmonary edema, renal failure, coagulation disturbances and placental abruption. -Our recommendation would be to continue inpatient management till delivery.  Outpatient management can lead to complications that may lead to delay in treatment. -I discussed timing of delivery.  In preeclampsia with severe features, we recommend delivery at [redacted] weeks gestation. -In the rare instance of her  blood pressure returning  completely to normal or mild hypertensive range, we may delay delivery beyond [redacted] weeks gestation. -Mode of delivery: Patient is very keen on having repeat cesarean delivery and she is considering bilateral tubal ligation.  She also had laparoscopic myomectomy and it is unclear whether the cavity was entered.  Abnormal glucose challenge test I recommend that 3-hour GTT to be deferred for at least a week after completion of course of steroids to avoid steroid induced hyperglycemia. While the patient is on steroids, I have recommended fasting and 1 or 2-hour postprandial glucose measurements and if hyperglycemia is seen sliding scale insulin should be considered.  Recommendations -Continue inpatient management and blood pressure monitoring as per protocol. -Discontinue magnesium sulfate after 48 hours. -Continue p.o. labetalol and add antihypertensives as needed. -Fasting and postprandial blood glucose measurements while on steroids. -Patient is ambulating with some restriction.  If prolonged bedrest is anticipated, prophylactic anticoagulation should be considered (unfractionated heparin 10,000 units every 12 hourly). -Delivery at 34-weeks' gestation. Delivery is indicated (not limited to) for following reasons:  -Persistent and uncontrolled hypertension not responding to antihypertensives  -Abnormal labs including increasing liver enzymes or thrombocytopenia (<100,000).  -Non-reassuring fetal heart trace.  -Oligohydramnios or abnormal antenatal testings.  -Oliguria (hourly output 20 mL to 30 mL).  -Increasing creatinine (?1.1) Severe symptoms of headache or visual spots or persistent right upper quadrant pain.  -Placental abruption.  ADDENDUM: Ultrasound for growth and BPP on 02/08/20. Thank you for your consultation.  If you have any questions, please contact me at the [facility].  Consultation including face-to-face counseling 45 minutes.

## 2020-02-04 NOTE — Progress Notes (Signed)

## 2020-02-04 NOTE — Progress Notes (Signed)
Last ultrasound on 01/24/20 at [facility]   -vertex/posperior placenta/AFI 17.5/EFW 3# 0 oz (1364g 80%)   Burman Foster, MSN, CNM 02/04/2020 8:47 PM

## 2020-02-04 NOTE — Progress Notes (Addendum)
Sarah Berry is a 34 y.o. [id number] at [redacted]w[redacted]d admitted for pre-eclampsia w/ severe features. Antenatal steroids and magnesium sulfate therapy.   Subjective:  HA has not been relieved w/ Tylenol and small meals. Recommended Fioricet and pt agrees to try.   Objective: BP 133/90 (BP Location: Right Arm)   Pulse 94   Temp 98 F (36.7 C) (Oral)   Resp 17   Ht 5' 7.5" (1.715 m)   Wt 95.4 kg   SpO2 99%   BMI 32.45 kg/m  I/O last 3 completed shifts: In: 180 [P.O.:180] Out: 925 [Urine:925] Total I/O In: 922.4 [P.O.:120; I.V.:802.4] Out: 200 [Urine:200]  FHT:  FHR: 125 bpm, variability: moderate,  accelerations:  Present,  decelerations:  Absent UC:   none SVE:     deferred  Labs: Lab Results  Component Value Date   WBC 8.9 02/03/2020   HGB 12.5 02/03/2020   HCT 38.4 02/03/2020   MCV 90.6 02/03/2020   PLT 196 02/03/2020   CMP     Component Value Date/Time   NA 137 02/03/2020 1657   K 4.0 02/03/2020 1657   CL 108 02/03/2020 1657   CO2 19 (L) 02/03/2020 1657   GLUCOSE 79 02/03/2020 1657   BUN 9 02/03/2020 1657   CREATININE 0.69 02/03/2020 1657   CALCIUM 9.9 02/03/2020 1657   PROT 6.2 (L) 02/03/2020 1657   ALBUMIN 2.8 (L) 02/03/2020 1657   AST 22 02/03/2020 1657   ALT 20 02/03/2020 1657   ALKPHOS 51 02/03/2020 1657   BILITOT 0.5 02/03/2020 1657   GFRNONAA >60 02/03/2020 1657   GFRAA >60 02/03/2020 1657   PCR 0.55  Assessment / Plan:  34 y.o. [name] at [redacted]w[redacted]d  Pre-eclampsia w/ severe features    -headache refractory to Tylenol, will add Fioricet    -continues magnesium sulfate for seizure prophylaxis    -last IV Labetalol 9/26 @1656 , started Labetalol 200mg  PO 9/26 @ 2145 Category 1 MFM consult w/ growth [place] and BPP pending for today CMP, CBC, protein creatine ratio today    [name], CNM 02/04/2020, 11:21 AM  I saw and examined patient at bedside and agree with above findings, assessment and plan as outlined above by [name], CNM.   Patient reports after fioricet usage her headache has resolved. She has no other complaints.  I reviewed recent vitals and labs: Vitals:   02/04/20 0512 02/04/20 0803 02/04/20 1103 02/04/20 1525  BP: 125/86 (!) 148/92 133/90 137/88  Pulse: 98 93 94 87  Resp: 15 16 17 18   Temp: 98.2 F (36.8 C) 98.5 F (36.9 C) 98 F (36.7 C) 98.8 F (37.1 C)  TempSrc: Oral Oral Oral Oral  SpO2:  99% 99% 99%  Weight:      Height:           CBC    Component Value Date/Time   WBC 12.0 (H) 02/04/2020 1256   RBC 3.60 (L) 02/04/2020 1256   HGB 10.5 (L) 02/04/2020 1256   HCT 32.5 (L) 02/04/2020 1256   PLT 179 02/04/2020 1256   MCV 90.3 02/04/2020 1256   MCH 29.2 02/04/2020 1256   MCHC 32.3 02/04/2020 1256   RDW 13.0 02/04/2020 1256   LYMPHSABS 1.1 02/04/2020 1256   MONOABS 0.7 02/04/2020 1256   EOSABS 0.0 02/04/2020 1256   BASOSABS 0.0 02/04/2020 1256   CMP     Component Value Date/Time   NA 132 (L) 02/04/2020 1256   K 4.2 02/04/2020 1256   CL  104 02/04/2020 1256   CO2 18 (L) 02/04/2020 1256   GLUCOSE 149 (H) 02/04/2020 1256   BUN 8 02/04/2020 1256   CREATININE 0.71 02/04/2020 1256   CALCIUM 7.1 (L) 02/04/2020 1256   PROT 5.6 (L) 02/04/2020 1256   ALBUMIN 2.5 (L) 02/04/2020 1256   AST 21 02/04/2020 1256   ALT 17 02/04/2020 1256   ALKPHOS 41 02/04/2020 1256   BILITOT 0.4 02/04/2020 1256   GFRNONAA >60 02/04/2020 1256   GFRAA >60 02/04/2020 1256  [name].  02/04/2020.

## 2020-02-05 LAB — GLUCOSE, CAPILLARY
Glucose-Capillary: 107 mg/dL — ABNORMAL HIGH (ref 70–99)
Glucose-Capillary: 107 mg/dL — ABNORMAL HIGH (ref 70–99)
Glucose-Capillary: 88 mg/dL (ref 70–99)
Glucose-Capillary: 138 mg/dL — ABNORMAL HIGH (ref 70–99)

## 2020-02-05 LAB — GROUP B STREP BY PCR: Group B strep by PCR: NEGATIVE

## 2020-02-05 NOTE — Progress Notes (Signed)
Pt without complaints.  No leakage of fluid or VB.  Good FM.  Pt had a headache that resolved with fioricet  BP 129/87 (BP Location: Right Arm)   Pulse 91   Temp 98.1 F (36.7 C) (Oral)   Resp 17   Ht 5' 7.5" (1.715 m)   Wt 95.4 kg   SpO2 99%   BMI 32.45 kg/m   FHTS 120 with good LTV and accels  Toco none  Pt in NAD CV RRR Lungs CTAB abd  Gravid soft and NT GU no vb EXt no calf tenderness Results for orders placed or performed during the hospital encounter of 02/03/20 (from the past 72 hour(s))  Urinalysis, Routine w reflex microscopic Urine, Clean Catch     Status: Abnormal   Collection Time: 02/03/20  4:31 PM  Result Value Ref Range   Color, Urine YELLOW YELLOW   APPearance CLOUDY (A) CLEAR   Specific Gravity, Urine 1.027 1.005 - 1.030   pH 5.0 5.0 - 8.0   Glucose, UA NEGATIVE NEGATIVE mg/dL   Hgb urine dipstick NEGATIVE NEGATIVE   Bilirubin Urine NEGATIVE NEGATIVE   Ketones, ur NEGATIVE NEGATIVE mg/dL   Protein, ur 100 (A) NEGATIVE mg/dL   Nitrite NEGATIVE NEGATIVE   Leukocytes,Ua NEGATIVE NEGATIVE   RBC / HPF 0-5 0 - 5 RBC/hpf   WBC, UA 0-5 0 - 5 WBC/hpf   Bacteria, UA RARE (A) NONE SEEN   Squamous Epithelial / LPF 21-50 0 - 5   Mucus PRESENT    Non Squamous Epithelial 0-5 (A) NONE SEEN    Comment: Performed at [facility], [address], [place], [place] [address]  Protein / creatinine ratio, urine     Status: Abnormal   Collection Time: 02/03/20  4:31 PM  Result Value Ref Range   Creatinine, Urine 229.35 mg/dL   Total Protein, Urine 127 mg/dL    Comment: NO NORMAL RANGE ESTABLISHED FOR THIS TEST   Protein Creatinine Ratio 0.55 (H) 0.00 - 0.15 mg/mg[Cre]    Comment: Performed at [facility], [place] [address], [place], [place] [address]  Type and screen [place]     Status: None   Collection Time: 02/03/20  4:50 PM  Result Value Ref Range   ABO/RH(D) O POS    Antibody Screen NEG    Sample Expiration       02/06/2020,2359 Performed at [facility], [place] [address] [place], [place] [address]   Respiratory Panel by RT PCR (Flu A&B, Covid) - Nasopharyngeal Swab     Status: None   Collection Time: 02/03/20  4:56 PM   Specimen: Nasopharyngeal Swab  Result Value Ref Range   SARS Coronavirus 2 by RT PCR NEGATIVE NEGATIVE    Comment: (NOTE) SARS-CoV-2 target nucleic acids are NOT DETECTED.  The SARS-CoV-2 RNA is generally detectable in upper respiratoy specimens during the acute phase of infection. The lowest concentration of SARS-CoV-2 viral copies this assay can detect is 131 copies/mL. A negative result does not preclude SARS-Cov-2 infection and should not be used as the sole basis for treatment or other patient management decisions. A negative result may occur with  improper specimen collection/handling, submission of specimen other than nasopharyngeal swab, presence of viral mutation(s) within the areas targeted by this assay, and inadequate number of viral copies (<131 copies/mL). A negative result must be combined with clinical observations, patient history, and epidemiological information. The expected result is Negative.  Fact Sheet for Patients:  PinkCheek.be  Fact Sheet for Healthcare Providers:  GravelBags.it  This test is no t yet approved or cleared by the [place] FDA and  has been authorized for detection and/or diagnosis of SARS-CoV-2 by FDA under an Emergency Use Authorization (EUA). This EUA will remain  in effect (meaning this test can be used) for the duration of the COVID-19 declaration under Section 564(b)(1) of the Act, 21 U.S.C. section 360bbb-3(b)(1), unless the authorization is terminated or revoked sooner.     Influenza A by PCR NEGATIVE NEGATIVE   Influenza B by PCR NEGATIVE NEGATIVE    Comment: (NOTE) The Xpert Xpress SARS-CoV-2/FLU/RSV assay is intended as an aid in  the diagnosis of  influenza from Nasopharyngeal swab specimens and  should not be used as a sole basis for treatment. Nasal washings and  aspirates are unacceptable for Xpert Xpress SARS-CoV-2/FLU/RSV  testing.  Fact Sheet for Patients: PinkCheek.be  Fact Sheet for Healthcare Providers: GravelBags.it  This test is not yet approved or cleared by the [place] FDA and  has been authorized for detection and/or diagnosis of SARS-CoV-2 by  FDA under an Emergency Use Authorization (EUA). This EUA will remain  in effect (meaning this test can be used) for the duration of the  Covid-19 declaration under Section 564(b)(1) of the Act, 21  U.S.C. section 360bbb-3(b)(1), unless the authorization is  terminated or revoked. Performed at [facility], [place] [address], [place], [place] [address]   Comprehensive metabolic panel     Status: Abnormal   Collection Time: 02/03/20  4:57 PM  Result Value Ref Range   Sodium 137 135 - 145 mmol/L   Potassium 4.0 3.5 - 5.1 mmol/L   Chloride 108 98 - 111 mmol/L   CO2 19 (L) 22 - 32 mmol/L   Glucose, Bld 79 70 - 99 mg/dL    Comment: Glucose reference range applies only to samples taken after fasting for at least 8 hours.   BUN 9 6 - 20 mg/dL   Creatinine, Ser 0.69 0.44 - 1.00 mg/dL   Calcium 9.9 8.9 - 10.3 mg/dL   Total Protein 6.2 (L) 6.5 - 8.1 g/dL   Albumin 2.8 (L) 3.5 - 5.0 g/dL   AST 22 15 - 41 U/L   ALT 20 0 - 44 U/L   Alkaline Phosphatase 51 38 - 126 U/L   Total Bilirubin 0.5 0.3 - 1.2 mg/dL   GFR calc non Af Amer >60 >60 mL/min   GFR calc Af Amer >60 >60 mL/min   Anion gap 10 5 - 15    Comment: Performed at [facility], [place] [address] [place] [address]  CBC     Status: None   Collection Time: 02/03/20  4:57 PM  Result Value Ref Range   WBC 8.9 4.0 - 10.5 K/uL   RBC 4.24 3.87 - 5.11 MIL/uL   Hemoglobin 12.5 12.0 - 15.0 g/dL   HCT 38.4 36 - 46 %   MCV 90.6 80.0 - 100.0 fL    MCH 29.5 26.0 - 34.0 pg   MCHC 32.6 30.0 - 36.0 g/dL   RDW 12.9 11.5 - 15.5 %   Platelets 196 150 - 400 K/uL   nRBC 0.0 0.0 - 0.2 %    Comment: Performed at [facility], [place] [address] [place], [place] [address]  Comprehensive metabolic panel     Status: Abnormal   Collection Time: 02/04/20 12:56 PM  Result Value Ref Range  Sodium 132 (L) 135 - 145 mmol/L   Potassium 4.2 3.5 - 5.1 mmol/L   Chloride 104 98 - 111 mmol/L   CO2 18 (L) 22 - 32 mmol/L   Glucose, Bld 149 (H) 70 - 99 mg/dL    Comment: Glucose reference range applies only to samples taken after fasting for at least 8 hours.   BUN 8 6 - 20 mg/dL   Creatinine, Ser 0.71 0.44 - 1.00 mg/dL   Calcium 7.1 (L) 8.9 - 10.3 mg/dL   Total Protein 5.6 (L) 6.5 - 8.1 g/dL   Albumin 2.5 (L) 3.5 - 5.0 g/dL   AST 21 15 - 41 U/L   ALT 17 0 - 44 U/L   Alkaline Phosphatase 41 38 - 126 U/L   Total Bilirubin 0.4 0.3 - 1.2 mg/dL   GFR calc non Af Amer >60 >60 mL/min   GFR calc Af Amer >60 >60 mL/min   Anion gap 10 5 - 15    Comment: Performed at [facility], [place] [address] [place], [place] [address]  CBC with Differential/Platelet     Status: Abnormal   Collection Time: 02/04/20 12:56 PM  Result Value Ref Range   WBC 12.0 (H) 4.0 - 10.5 K/uL   RBC 3.60 (L) 3.87 - 5.11 MIL/uL   Hemoglobin 10.5 (L) 12.0 - 15.0 g/dL   HCT 32.5 (L) 36 - 46 %   MCV 90.3 80.0 - 100.0 fL   MCH 29.2 26.0 - 34.0 pg   MCHC 32.3 30.0 - 36.0 g/dL   RDW 13.0 11.5 - 15.5 %   Platelets 179 150 - 400 K/uL   nRBC 0.0 0.0 - 0.2 %   Neutrophils Relative % 84 %   Neutro Abs 10.1 (H) 1.7 - 7.7 K/uL   Lymphocytes Relative 9 %   Lymphs Abs 1.1 0.7 - 4.0 K/uL   Monocytes Relative 6 %   Monocytes Absolute 0.7 0 - 1 K/uL   Eosinophils Relative 0 %   Eosinophils Absolute 0.0 0 - 0 K/uL   Basophils Relative 0 %   Basophils Absolute 0.0 0 - 0 K/uL   Immature Granulocytes 1 %   Abs Immature Granulocytes 0.15 (H) 0.00 - 0.07 K/uL    Comment: Performed at [facility] [address], [place],  [address]  Protein / creatinine ratio, urine     Status: None   Collection Time: 02/04/20  1:21 PM  Result Value Ref Range   Creatinine, Urine 60.67 mg/dL   Total Protein, Urine 8 mg/dL    Comment: NO NORMAL RANGE ESTABLISHED FOR THIS TEST   Protein Creatinine Ratio 0.13 0.00 - 0.15 mg/mg[Cre]    Comment: Performed at [facility] [address], [place], [place] [address]  Glucose, capillary     Status: Abnormal   Collection Time: 02/05/20  7:30 AM  Result Value Ref Range   Glucose-Capillary 107 (H) 70 - 99 mg/dL    Comment: Glucose reference range applies only to samples taken after fasting for at least 8 hours.  Group B strep by PCR     Status: None   Collection Time: 02/05/20  8:41 AM   Specimen: Urine, Voided; Genital  Result Value Ref Range   Group B strep by PCR NEGATIVE NEGATIVE    Comment: (NOTE) Intrapartum testing with Xpert GBS assay should be used as an adjunct to other methods available and not used to replace antepartum testing (at 35-[redacted] weeks gestation).  Performed at [facility], [place] [address], [place], [place] [address]   Glucose, capillary     Status: Abnormal   Collection Time: 02/05/20 12:18 PM  Result Value Ref Range   Glucose-Capillary 107 (H) 70 - 99 mg/dL    Comment: Glucose reference range applies only to samples taken after fasting for at least 8 hours.    Assessment and Plan [redacted]w[redacted]d Preeclampsia with severe features Pt stable on magnesium.  Will DC this evening and continue labetalol.  She received Beta methasone Labs twice a week Pt with elevated one hour glucola.  Plan three hour glucola in one week Plan for delivery at 34 weeks NICU consult done.  Pt is aware of the parameters in which she would need to go earlier.  She will have a repeat CS Monitor closely  Patient ID: Sarah Berry, female   DOB: 1985-10-17, 34 y.o.   MRN: [id number]

## 2020-02-05 NOTE — Progress Notes (Signed)
Miyonna Ormiston is a 34 y.o. [id number] at [redacted]w[redacted]d admitted for pre-eclampsia w/ severe features. Antenatal steroids and magnesium sulfate therapy.   Subjective:  Resting in bed, denies neural sx, HA resolved after Fioricet but feel pressure behind her eyes as it may return. Generally feeling tired from the Magnesium Sulfate infusion.  Objective: Patient Vitals for the past 24 hrs:  BP Temp Temp src Pulse Resp SpO2  02/05/20 0838 129/87 98.1 F (36.7 C) Oral 87 16 --  02/05/20 0307 125/74 97.9 F (36.6 C) Axillary 89 14 99 %  02/04/20 2300 (!) 141/90 97.9 F (36.6 C) Oral 90 16 --  02/04/20 1957 (!) 143/90 98.3 F (36.8 C) Oral 87 18 98 %  02/04/20 1525 137/88 98.8 F (37.1 C) Oral 87 18 99 %  02/04/20 1103 133/90 98 F (36.7 C) Oral 94 17 99 %    FHT:  FHR: 125 bpm, variability: moderate,  accelerations:  Present,  decelerations:  Absent UC:   none SVE:     deferred  Labs: Results for orders placed or performed during the hospital encounter of 02/03/20 (from the past 24 hour(s))  Comprehensive metabolic panel     Status: Abnormal   Collection Time: 02/04/20 12:56 PM  Result Value Ref Range   Sodium 132 (L) 135 - 145 mmol/L   Potassium 4.2 3.5 - 5.1 mmol/L   Chloride 104 98 - 111 mmol/L   CO2 18 (L) 22 - 32 mmol/L   Glucose, Bld 149 (H) 70 - 99 mg/dL   BUN 8 6 - 20 mg/dL   Creatinine, Ser 0.71 0.44 - 1.00 mg/dL   Calcium 7.1 (L) 8.9 - 10.3 mg/dL   Total Protein 5.6 (L) 6.5 - 8.1 g/dL   Albumin 2.5 (L) 3.5 - 5.0 g/dL   AST 21 15 - 41 U/L   ALT 17 0 - 44 U/L   Alkaline Phosphatase 41 38 - 126 U/L   Total Bilirubin 0.4 0.3 - 1.2 mg/dL   GFR calc non Af Amer >60 >60 mL/min   GFR calc Af Amer >60 >60 mL/min   Anion gap 10 5 - 15  CBC with Differential/Platelet     Status: Abnormal   Collection Time: 02/04/20 12:56 PM  Result Value Ref Range   WBC 12.0 (H) 4.0 - 10.5 K/uL   RBC 3.60 (L) 3.87 - 5.11 MIL/uL   Hemoglobin 10.5 (L) 12.0 - 15.0 g/dL   HCT 32.5 (L) 36 - 46 %    MCV 90.3 80.0 - 100.0 fL   MCH 29.2 26.0 - 34.0 pg   MCHC 32.3 30.0 - 36.0 g/dL   RDW 13.0 11.5 - 15.5 %   Platelets 179 150 - 400 K/uL   nRBC 0.0 0.0 - 0.2 %   Neutrophils Relative % 84 %   Neutro Abs 10.1 (H) 1.7 - 7.7 K/uL   Lymphocytes Relative 9 %   Lymphs Abs 1.1 0.7 - 4.0 K/uL   Monocytes Relative 6 %   Monocytes Absolute 0.7 0 - 1 K/uL   Eosinophils Relative 0 %   Eosinophils Absolute 0.0 0 - 0 K/uL   Basophils Relative 0 %   Basophils Absolute 0.0 0 - 0 K/uL   Immature Granulocytes 1 %   Abs Immature Granulocytes 0.15 (H) 0.00 - 0.07 K/uL  Protein / creatinine ratio, urine     Status: None   Collection Time: 02/04/20  1:21 PM  Result Value Ref Range   Creatinine, Urine 60.67 mg/dL  Total Protein, Urine 8 mg/dL   Protein Creatinine Ratio 0.13 0.00 - 0.15 mg/mg[Cre]  Glucose, capillary     Status: Abnormal   Collection Time: 02/05/20  7:30 AM  Result Value Ref Range   Glucose-Capillary 107 (H) 70 - 99 mg/dL    Assessment / Plan:  34 y.o. [id number] at [redacted]w[redacted]d  Pre-eclampsia w/ severe features  - BP normal to mild range on current regimen, labs stable    -headache resolved with Fioricet    -Mag Sulfate neuroprophylaxis x 48 hrs due complete at 1700 today    -s/p IV Labetalol 9/26 @1656 , started Labetalol 200mg  PO 9/26 @ 2145 FHT Category 1 MFM consult w/ growth [place] and BPP completed  - reccs - inpatient until delivery at 34 wks, sooner with exacerbation               - growth sono and BPP 10/1   - 3GTT at 1 week after steroids completed, CBG in meantime, insulin SS coverage PRN  Juliene Pina, MSN, CNM 02/05/2020, 10:43 AM

## 2020-02-06 LAB — CBC WITH DIFFERENTIAL/PLATELET
Abs Immature Granulocytes: 0.16 10*3/uL — ABNORMAL HIGH (ref 0.00–0.07)
Basophils Absolute: 0 10*3/uL (ref 0.0–0.1)
Basophils Relative: 0 %
Eosinophils Absolute: 0 10*3/uL (ref 0.0–0.5)
Eosinophils Relative: 0 %
HCT: 33 % — ABNORMAL LOW (ref 36.0–46.0)
Hemoglobin: 10.7 g/dL — ABNORMAL LOW (ref 12.0–15.0)
Immature Granulocytes: 1 %
Lymphocytes Relative: 17 %
Lymphs Abs: 1.9 10*3/uL (ref 0.7–4.0)
MCH: 29.9 pg (ref 26.0–34.0)
MCHC: 32.4 g/dL (ref 30.0–36.0)
MCV: 92.2 fL (ref 80.0–100.0)
Monocytes Absolute: 1 10*3/uL (ref 0.1–1.0)
Monocytes Relative: 9 %
Neutro Abs: 8 10*3/uL — ABNORMAL HIGH (ref 1.7–7.7)
Neutrophils Relative %: 73 %
Platelets: 178 10*3/uL (ref 150–400)
RBC: 3.58 MIL/uL — ABNORMAL LOW (ref 3.87–5.11)
RDW: 13.4 % (ref 11.5–15.5)
WBC: 11.2 10*3/uL — ABNORMAL HIGH (ref 4.0–10.5)
nRBC: 0.3 % — ABNORMAL HIGH (ref 0.0–0.2)

## 2020-02-06 LAB — GLUCOSE, CAPILLARY
Glucose-Capillary: 75 mg/dL (ref 70–99)
Glucose-Capillary: 74 mg/dL (ref 70–99)
Glucose-Capillary: 89 mg/dL (ref 70–99)
Glucose-Capillary: 117 mg/dL — ABNORMAL HIGH (ref 70–99)

## 2020-02-06 LAB — COMPREHENSIVE METABOLIC PANEL
ALT: 22 U/L (ref 0–44)
AST: 26 U/L (ref 15–41)
Albumin: 2.5 g/dL — ABNORMAL LOW (ref 3.5–5.0)
Alkaline Phosphatase: 44 U/L (ref 38–126)
Anion gap: 7 (ref 5–15)
BUN: 10 mg/dL (ref 6–20)
CO2: 23 mmol/L (ref 22–32)
Calcium: 8.3 mg/dL — ABNORMAL LOW (ref 8.9–10.3)
Chloride: 108 mmol/L (ref 98–111)
Creatinine, Ser: 0.63 mg/dL (ref 0.44–1.00)
GFR calc Af Amer: >60 mL/min (ref >60)
GFR calc non Af Amer: >60 mL/min (ref >60)
Glucose, Bld: 91 mg/dL (ref 70–99)
Potassium: 3.6 mmol/L (ref 3.5–5.1)
Sodium: 138 mmol/L (ref 135–145)
Total Bilirubin: 0.2 mg/dL — ABNORMAL LOW (ref 0.3–1.2)
Total Protein: 5.5 g/dL — ABNORMAL LOW (ref 6.5–8.1)

## 2020-02-06 MED ORDER — LABETALOL HCL 200 MG PO TABS
200.0000 mg | ORAL_TABLET | Freq: Once | ORAL | Status: AC
  Administered 2020-02-06: 200 mg via ORAL
  Filled 2020-02-06: qty 1

## 2020-02-06 MED ORDER — LABETALOL HCL 200 MG PO TABS
400.0000 mg | ORAL_TABLET | Freq: Three times a day (TID) | ORAL | Status: DC
  Administered 2020-02-06 – 2020-02-07 (×3): 400 mg via ORAL
  Filled 2020-02-06 (×3): qty 2

## 2020-02-06 NOTE — Progress Notes (Addendum)
Sarah Berry a 63 y.M.[id number] at [redacted]w[redacted]d admitted for pre-eclampsia w/ severe features.  Hospital Day #4 S/P Antenatal steroids and magnesium sulfate therapy. NICU and MFM consults completed   S:  Reports feeling overwhelmed today w/ lengthy hospital stay and loss of control over health. Pt has been crying and felt that BP was elevated despite efforts to manage stress and minimize stimulation. Reports frontal HA but concerned about Fioricet use. Denies visual changes and epigastric pain. Reassured of Fioricet safety in pregnancy.   O: Blood pressure 127/73, pulse 76, temperature 98.6 F (37 C), temperature source Oral, resp. rate 16, height 5' 7.5" (1.715 m), weight 95.4 kg, SpO2 96 %  NST: FHR 130/moderate variability/accels present/decels absent Ctx:   None Gen: NAD, AAO x3 Abd: soft, gravid, nontender GU: neg for bleeding and loss of fluid Ext: neg for pain, tenderness, and cords CMP     Component Value Date/Time   NA 138 02/06/2020 1404   K 3.6 02/06/2020 1404   CL 108 02/06/2020 1404   CO2 23 02/06/2020 1404   GLUCOSE 91 02/06/2020 1404   BUN 10 02/06/2020 1404   CREATININE 0.63 02/06/2020 1404   CALCIUM 8.3 (L) 02/06/2020 1404   PROT 5.5 (L) 02/06/2020 1404   ALBUMIN 2.5 (L) 02/06/2020 1404   AST 26 02/06/2020 1404   ALT 22 02/06/2020 1404   ALKPHOS 44 02/06/2020 1404   BILITOT 0.2 (L) 02/06/2020 1404   GFRNONAA >60 02/06/2020 1404   GFRAA >60 02/06/2020 1404   CBC    Component Value Date/Time   WBC 11.2 (H) 02/06/2020 1404   RBC 3.58 (L) 02/06/2020 1404   HGB 10.7 (L) 02/06/2020 1404   HCT 33.0 (L) 02/06/2020 1404   PLT 178 02/06/2020 1404   MCV 92.2 02/06/2020 1404   MCH 29.9 02/06/2020 1404   MCHC 32.4 02/06/2020 1404   RDW 13.4 02/06/2020 1404   LYMPHSABS 1.9 02/06/2020 1404   MONOABS 1.0 02/06/2020 1404   EOSABS 0.0 02/06/2020 1404   BASOSABS 0.0 02/06/2020 1404    Intake/Output Summary (Last 24 hours) at 02/06/2020 1720 Last data filed at  02/06/2020 1718 Gross per 24 hour  Intake 240 ml  Output 400 ml  Net -160 ml   A/P: [id number] [redacted]w[redacted]d Preeclampsia with severe features    -S/P magnesium    -Increased PO Labetalol form 200 mg BID to 400 mg TID   Antenatal steroid complete NICU consult completed MFM consult complete    -recc delivery @ 34 wks    -prev C/S desires repeat    -[place] w/ BPP 02/08/20 NST BID  Pt with elevated one hour glucola.  Plan three hour glucola in one week Plan for delivery at 34 weeks NICU consult done. Pt is aware of the parameters in which she would need to go earlier. She will have a repeat CS Monitor closely    Burman Foster, MSN, CNM 02/06/2020 11:48 AM  Call from Dr. Alwyn Pea who is specialing pt about pt's status and concerns.  Labetalol increased to 400 mg TID and repeeat labs ordred.  HA ultimately improved with fioricet  After tylenol alone did not work. Will cont to observe.

## 2020-02-07 LAB — GLUCOSE, CAPILLARY
Glucose-Capillary: 65 mg/dL — ABNORMAL LOW (ref 70–99)
Glucose-Capillary: 105 mg/dL — ABNORMAL HIGH (ref 70–99)
Glucose-Capillary: 89 mg/dL (ref 70–99)
Glucose-Capillary: 78 mg/dL (ref 70–99)

## 2020-02-07 MED ORDER — NIFEDIPINE ER OSMOTIC RELEASE 30 MG PO TB24
60.0000 mg | ORAL_TABLET | Freq: Every day | ORAL | Status: DC
  Administered 2020-02-07 – 2020-02-08 (×2): 60 mg via ORAL
  Filled 2020-02-07 (×2): qty 2

## 2020-02-07 NOTE — Progress Notes (Addendum)
Hospital day # 4 pregnancy at [redacted]w[redacted]d--Preeclampsia with severe features.  S:  Pt in room with husband at bedside and stable. Pt in NAD. Reviewed POC and pt aware of plan with no questions. Pt endorses having slight headache behind her eyes, which does improve with Fioricet. Pt endorses having h/o menstrual headaches. Pt endorses seeing one or two stars in her vision from time to time, but overall feeling better. She did state her eye were more swollen then usual. Denies RUQ pain. Pt +FM. Pt discussed procardia managed her PP BP issues with her last pregnancy, where labetalol was not very helpfull;. RN report severe range BP of 163/101 and 161/102 @ 1023, IV labetalol given with repeat BP of 153/96. Pt was resting in bed at that time.        Perception of contractions: none      Vaginal bleeding: none now       Vaginal discharge:  no significant change  O: BP (!) 158/103   Pulse 68   Temp 98 F (36.7 C) (Oral)   Resp 16   Ht 5' 7.5" (1.715 m)   Wt 95.4 kg   SpO2 99%   BMI 32.45 kg/m       Fetal tracings: Baseline 130s, moderate variability, +acel, -decels. Reactive.       Contractions:   None      Uterus gravid and non-tender   Physical Exam Vitals and nursing note reviewed.  Constitutional:      Appearance: She is well, happy.  Abdominal:     Palpations: Abdomen is soft.     Tenderness: There is no abdominal tenderness.  Musculoskeletal:        General: Swelling present.  Neurological:     Mental Status: She is alert and oriented to person, place, and time.     Deep Tendon Reflexes: Reflexes normal. No clonus Psychiatric:        Mood and Affect: Mood is happy Extremities:     extremities normal, atraumatic, no cyanosis or edema, edema generalized noted, Homans sign is negative, no sign of DVT, no edema, redness or tenderness in the calves or thighs and no ulcers, gangrene or trophic changes        Labs:  PCR on 9/26 0.55, 9/27 0.13. Creatinine, AST/ALT, platelets unremarkable.         Meds: Was on labetalol 400mg  TID switched to procardia 60mg  XL now.  CBG (last 3)  Recent Labs    02/06/20 2045 02/07/20 0630 02/07/20 1032  GLUCAP 117* 65* 105*    Intake/Output Summary (Last 24 hours) at 02/07/2020 1211 Last data filed at 02/06/2020 2240 Gross per 24 hour  Intake 120 ml  Output 550 ml  Net -430 ml    A/P: Hospital day # 4 pregnancy at [redacted]w[redacted]d--Preeclampsia with severe features.  Preeclampsia with severe features: Stable. Admit on 9/26 @ 39.4 for severe range BP, fatigue, HA, vision changes, dx with preeclampsia with severe features. HA resolved with Fioricet. Mild headache behind eyes this morning. 9/26 PCR 0.55, 9/17 PCR 0.13. All creatinine, cmp, AST, ALT, creatinine, and platelets have remained unremarkable during her stay. Repeat labs taken twice weekly, saturdays and wednesdays. S/P 24 hours magnesium. Continue IV labetalol PRN protocol for severe range Bps required IV labetalol upon admission and today for x2 severe range Bps which resolved, switch labetalol 400mg  TID to procardia 60mg  XL now after consult with Dr Alwyn Pea. Low sodium regular diet.  MFM consult done, recommendations below: delivery at 47  weeks, unless worsening of preE.    H/O Previous CS: Stable. Desires repeat CS, but may change mind as time progresses.   Fetal Wellbeing: Reactive NST. NST QSHift. Fetal growth scan and BPP 10-1. BMZ given 9/26 & 9/27. NICU consult completed 9/26.  Failed 1H GTT:  Stable, CBG WNL. 3H GTT on 10/5, continue to monitor CBG PP and fasting. Insulin Sliding scale coverage PRN.   DR Alwyn Pea aware.    [facility] CNM, MSN 02/07/2020 11:41 AM

## 2020-02-08 ENCOUNTER — Inpatient Hospital Stay (HOSPITAL_BASED_OUTPATIENT_CLINIC_OR_DEPARTMENT_OTHER): Payer: BLUE CROSS/BLUE SHIELD

## 2020-02-08 DIAGNOSIS — O09293 Supervision of pregnancy with other poor reproductive or obstetric history, third trimester: Secondary | ICD-10-CM | POA: Diagnosis not present

## 2020-02-08 DIAGNOSIS — D259 Leiomyoma of uterus, unspecified: Secondary | ICD-10-CM

## 2020-02-08 DIAGNOSIS — O3413 Maternal care for benign tumor of corpus uteri, third trimester: Secondary | ICD-10-CM

## 2020-02-08 DIAGNOSIS — O1413 Severe pre-eclampsia, third trimester: Secondary | ICD-10-CM | POA: Diagnosis not present

## 2020-02-08 DIAGNOSIS — O09213 Supervision of pregnancy with history of pre-term labor, third trimester: Secondary | ICD-10-CM

## 2020-02-08 DIAGNOSIS — Z3A30 30 weeks gestation of pregnancy: Secondary | ICD-10-CM

## 2020-02-08 DIAGNOSIS — O09523 Supervision of elderly multigravida, third trimester: Secondary | ICD-10-CM

## 2020-02-08 DIAGNOSIS — Z3689 Encounter for other specified antenatal screening: Secondary | ICD-10-CM

## 2020-02-08 DIAGNOSIS — O34219 Maternal care for unspecified type scar from previous cesarean delivery: Secondary | ICD-10-CM

## 2020-02-08 LAB — CBC
HCT: 35.6 % — ABNORMAL LOW (ref 36.0–46.0)
Hemoglobin: 11.7 g/dL — ABNORMAL LOW (ref 12.0–15.0)
MCH: 29.3 pg (ref 26.0–34.0)
MCHC: 32.9 g/dL (ref 30.0–36.0)
MCV: 89.2 fL (ref 80.0–100.0)
Platelets: 179 10*3/uL (ref 150–400)
RBC: 3.99 MIL/uL (ref 3.87–5.11)
RDW: 13 % (ref 11.5–15.5)
WBC: 13.3 10*3/uL — ABNORMAL HIGH (ref 4.0–10.5)
nRBC: 0.2 % (ref 0.0–0.2)

## 2020-02-08 LAB — GLUCOSE, CAPILLARY
Glucose-Capillary: 85 mg/dL (ref 70–99)
Glucose-Capillary: 98 mg/dL (ref 70–99)
Glucose-Capillary: 107 mg/dL — ABNORMAL HIGH (ref 70–99)
Glucose-Capillary: 119 mg/dL — ABNORMAL HIGH (ref 70–99)

## 2020-02-08 LAB — COMPREHENSIVE METABOLIC PANEL
ALT: 38 U/L (ref 0–44)
AST: 38 U/L (ref 15–41)
Albumin: 2.5 g/dL — ABNORMAL LOW (ref 3.5–5.0)
Alkaline Phosphatase: 57 U/L (ref 38–126)
Anion gap: 11 (ref 5–15)
BUN: 13 mg/dL (ref 6–20)
CO2: 20 mmol/L — ABNORMAL LOW (ref 22–32)
Calcium: 10 mg/dL (ref 8.9–10.3)
Chloride: 109 mmol/L (ref 98–111)
Creatinine, Ser: 0.61 mg/dL (ref 0.44–1.00)
GFR calc Af Amer: >60 mL/min (ref >60)
GFR calc non Af Amer: >60 mL/min (ref >60)
Glucose, Bld: 102 mg/dL — ABNORMAL HIGH (ref 70–99)
Potassium: 3.9 mmol/L (ref 3.5–5.1)
Sodium: 140 mmol/L (ref 135–145)
Total Bilirubin: 0.6 mg/dL (ref 0.3–1.2)
Total Protein: 5.7 g/dL — ABNORMAL LOW (ref 6.5–8.1)

## 2020-02-08 LAB — URIC ACID: Uric Acid, Serum: 6.6 mg/dL (ref 2.5–7.1)

## 2020-02-08 LAB — TYPE AND SCREEN
ABO/RH(D): O POS
Antibody Screen: NEGATIVE

## 2020-02-08 LAB — LACTATE DEHYDROGENASE: LDH: 181 U/L (ref 98–192)

## 2020-02-08 MED ORDER — MAGNESIUM SULFATE 40 GM/1000ML IV SOLN
2.0000 g/h | INTRAVENOUS | Status: AC
  Administered 2020-02-08 – 2020-02-09 (×2): 2 g/h via INTRAVENOUS
  Filled 2020-02-08: qty 1000

## 2020-02-08 MED ORDER — MAGNESIUM SULFATE 40 GM/1000ML IV SOLN
INTRAVENOUS | Status: AC
  Filled 2020-02-08: qty 1000

## 2020-02-08 MED ORDER — SIMETHICONE 80 MG PO CHEW
80.0000 mg | CHEWABLE_TABLET | Freq: Four times a day (QID) | ORAL | Status: DC | PRN
  Administered 2020-02-08: 80 mg via ORAL
  Filled 2020-02-08: qty 1

## 2020-02-08 MED ORDER — ALPRAZOLAM 0.25 MG PO TABS
0.5000 mg | ORAL_TABLET | Freq: Three times a day (TID) | ORAL | Status: DC | PRN
  Administered 2020-02-08 – 2020-02-12 (×4): 0.5 mg via ORAL
  Filled 2020-02-08 (×4): qty 2

## 2020-02-08 MED ORDER — LABETALOL HCL 200 MG PO TABS: 400.0000 mg | ORAL_TABLET | Freq: Three times a day (TID) | ORAL | Status: DC

## 2020-02-08 MED ORDER — ACETAMINOPHEN 500 MG PO TABS: 1000.0000 mg | ORAL_TABLET | Freq: Once | ORAL | Status: DC

## 2020-02-08 MED ORDER — LACTATED RINGERS IV SOLN: INTRAVENOUS | Status: DC

## 2020-02-08 NOTE — Progress Notes (Signed)
Introduced spiritual care services to [place] and her husband.  They were in good spirits and were looking forward, in particular, to having their daughter come up to have lunch with [facility] tomorrow.  The separation from her daughter has been really hard and although she has experience being in the hospital during pregnancy, she did not have a child at home to care for when she was here during her last pregnancy.  They have very good family support.   We will continue to check in as we are able, but please also page as needs arise.  Sarah Berry, [place] Pager, [phone] 4:48 PM

## 2020-02-08 NOTE — Plan of Care (Signed)
  Problem: Education: Goal: Knowledge of General Education information will improve Description: Including pain rating scale, medication(s)/side effects and non-pharmacologic comfort measures Outcome: Completed/Met   Problem: Activity: Goal: Risk for activity intolerance will decrease Outcome: Completed/Met   Problem: Nutrition: Goal: Adequate nutrition will be maintained Outcome: Completed/Met   Problem: Coping: Goal: Level of anxiety will decrease Outcome: Completed/Met   Problem: Elimination: Goal: Will not experience complications related to urinary retention Outcome: Completed/Met   Problem: Safety: Goal: Ability to remain free from injury will improve Outcome: Completed/Met   Problem: Education: Goal: Knowledge of disease or condition will improve Outcome: Completed/Met   Problem: Education: Goal: Knowledge of disease or condition will improve Outcome: Completed/Met Goal: Knowledge of the prescribed therapeutic regimen will improve Outcome: Completed/Met

## 2020-02-08 NOTE — Progress Notes (Addendum)
Sarah Berry is a 34 y.o. [id number] at [redacted]w[redacted]d admitted for pre-eclampsia w/ severe features. S/P antenatal steroids and magnesium sulfate therapy.   Subjective: Called by RN for 2 severe range BP that required Labetalol protocol to start. Pt denies headache, epigastric pain, or visual changes.   Objective: Patient Vitals for the past 24 hrs:  BP Temp Temp src Pulse Resp SpO2  02/08/20 2135 (!) 156/86 -- -- 93 18 98 %  02/08/20 2120 (!) 185/101 -- -- 78 20 --  02/08/20 2115 -- -- -- -- -- 98 %  02/08/20 2110 (!) 165/105 -- -- 84 -- --  02/08/20 2053 (!) 175/105 -- -- 88 18 100 %  02/08/20 2023 (!) 164/99 98.2 F (36.8 C) Oral 80 18 98 %  02/08/20 1506 131/75 98.4 F (36.9 C) Axillary 79 18 100 %  02/08/20 1143 139/89 98.3 F (36.8 C) Oral 89 18 100 %  02/08/20 0820 (!) 153/104 97.9 F (36.6 C) Oral 81 18 99 %  02/08/20 0408 (!) 148/95 98.5 F (36.9 C) Oral 74 16 100 %  02/07/20 2250 (!) 159/90 98.3 F (36.8 C) Oral 75 18 98 %   FHT:  FHR: 130 bpm, variability: moderate,  accelerations:  Present, decelerations:  Absent UC:   none SVE:     deferred  Labs: Results for orders placed or performed during the hospital encounter of 02/03/20 (from the past 24 hour(s))  Glucose, capillary     Status: None   Collection Time: 02/08/20  6:04 AM  Result Value Ref Range   Glucose-Capillary 85 70 - 99 mg/dL  Glucose, capillary     Status: None   Collection Time: 02/08/20 11:58 AM  Result Value Ref Range   Glucose-Capillary 98 70 - 99 mg/dL  Glucose, capillary     Status: Abnormal   Collection Time: 02/08/20  3:02 PM  Result Value Ref Range   Glucose-Capillary 107 (H) 70 - 99 mg/dL  Glucose, capillary     Status: Abnormal   Collection Time: 02/08/20  8:48 PM  Result Value Ref Range   Glucose-Capillary 119 (H) 70 - 99 mg/dL   Assessment / Plan: 34 y.o. [id number] at [redacted]w[redacted]d  Pre-eclampsia w/ severe features  - Severe range BP     - Started Labetalol protocol, 20mg  given  - Hydralazine  10mg  given  - Will restart Mag SO4 at 2gm/hr  - Stat labs  - Continue BP monitoring every 15 minutes Fetal Well Being   - Category 1 FHT  Dr. Alwyn Pea updated on patient status and en route to the hospital.   [name], MSN, CNM 02/08/2020, 9:43 PM    MD Addendum:  Came to bedside when I was alerted to severe range blood pressures.  Patient denies headache or blurry vision or scotomata.  Patient very tearful and anxious.   Blood pressure ranges as above.  IV labetalol x 1 dose and 1 dose of hydralazine.  Discussed with patient that if we have to continue to push IV anti-hypertensive medications to control her blood pressures, then will proceed with repeat cesarean section.    [name]

## 2020-02-08 NOTE — Progress Notes (Signed)
Sarah Berry is a 34 y.o. [id number] at [redacted]w[redacted]d admitted for pre-eclampsia w/ severe features. S/P antenatal steroids and magnesium sulfate therapy.   Subjective: Resting in bed, denies neural sx, states HA is 1-2/10 on pain scale. Feeling optimistic.   Objective: Patient Vitals for the past 24 hrs:  BP Temp Temp src Pulse Resp SpO2  02/08/20 1143 139/89 98.3 F (36.8 C) Oral 89 18 100 %  02/08/20 0820 (!) 153/104 97.9 F (36.6 C) Oral 81 18 99 %  02/08/20 0408 (!) 148/95 98.5 F (36.9 C) Oral 74 16 100 %  02/07/20 2250 (!) 159/90 98.3 F (36.8 C) Oral 75 18 98 %  02/07/20 1947 (!) 155/95 98.5 F (36.9 C) Oral 78 18 99 %  02/07/20 1545 (!) 145/87 99 F (37.2 C) Oral 71 17 98 %    FHT:  FHR: 130 bpm, variability: moderate,  accelerations:  Present, decelerations:  Absent UC:   none SVE:     deferred  Labs: Results for orders placed or performed during the hospital encounter of 02/03/20 (from the past 24 hour(s))  Glucose, capillary     Status: None   Collection Time: 02/07/20  2:19 PM  Result Value Ref Range   Glucose-Capillary 89 70 - 99 mg/dL   Comment 1 Notify RN    Comment 2 Document in Chart   Glucose, capillary     Status: None   Collection Time: 02/07/20  8:04 PM  Result Value Ref Range   Glucose-Capillary 78 70 - 99 mg/dL  Glucose, capillary     Status: None   Collection Time: 02/08/20  6:04 AM  Result Value Ref Range   Glucose-Capillary 85 70 - 99 mg/dL  Glucose, capillary     Status: None   Collection Time: 02/08/20 11:58 AM  Result Value Ref Range   Glucose-Capillary 98 70 - 99 mg/dL    Assessment / Plan:  34 y.o. [id number] at [redacted]w[redacted]d  Pre-eclampsia w/ severe features  - BP elevated today, no severe range BP, labs stable, no PEC symptoms except mild headache  - Started Procardia XL 30 mg last night, increased to 60mg  today Fetal Well Being   - U/S today for BPP and growth; Vertex, posterior placenta, AFI 17.9cm, EFW 1635gms, 3lb 10oz, 60%, BPP 6/8, 2 off  for breathing  - Category 1 FHT MFM Recommendations  - Inpatient management until delivery at 34 wks, sooner with exacerbation  - 3GTT at 1 week after steroids completed, CBG in meantime, insulin SS coverage PRN  Dr. Alwyn Pea updated on patient status/  [name], MSN, CNM 02/08/2020, 12:15 PM

## 2020-02-09 ENCOUNTER — Encounter (HOSPITAL_COMMUNITY)
Admission: AD | Disposition: A | Discharge status: Home / Self Care | Payer: Self-pay | Source: Home / Self Care | Attending: Obstetrics & Gynecology

## 2020-02-09 ENCOUNTER — Inpatient Hospital Stay (HOSPITAL_COMMUNITY): Payer: BLUE CROSS/BLUE SHIELD | Admitting: Anesthesiology

## 2020-02-09 ENCOUNTER — Encounter (HOSPITAL_COMMUNITY): Payer: Self-pay | Admitting: Obstetrics & Gynecology

## 2020-02-09 DIAGNOSIS — O1493 Unspecified pre-eclampsia, third trimester: Secondary | ICD-10-CM | POA: Diagnosis not present

## 2020-02-09 DIAGNOSIS — Z3A30 30 weeks gestation of pregnancy: Secondary | ICD-10-CM | POA: Diagnosis not present

## 2020-02-09 DIAGNOSIS — Z98891 History of uterine scar from previous surgery: Secondary | ICD-10-CM

## 2020-02-09 HISTORY — PX: TUBAL LIGATION: SHX77

## 2020-02-09 LAB — CBC
HCT: 37.2 % (ref 36.0–46.0)
Hemoglobin: 12.6 g/dL (ref 12.0–15.0)
MCH: 30.4 pg (ref 26.0–34.0)
MCHC: 33.9 g/dL (ref 30.0–36.0)
MCV: 89.9 fL (ref 80.0–100.0)
Platelets: 173 10*3/uL (ref 150–400)
RBC: 4.14 MIL/uL (ref 3.87–5.11)
RDW: 13.2 % (ref 11.5–15.5)
WBC: 20.1 10*3/uL — ABNORMAL HIGH (ref 4.0–10.5)
nRBC: 0 % (ref 0.0–0.2)

## 2020-02-09 LAB — GLUCOSE, CAPILLARY
Glucose-Capillary: 85 mg/dL (ref 70–99)
Glucose-Capillary: 99 mg/dL (ref 70–99)

## 2020-02-09 SURGERY — Surgical Case
Anesthesia: Spinal

## 2020-02-09 MED ORDER — WITCH HAZEL-GLYCERIN EX PADS: 1.0000 "application " | MEDICATED_PAD | CUTANEOUS | Status: DC | PRN

## 2020-02-09 MED ORDER — OXYCODONE HCL 5 MG PO TABS: 5.0000 mg | ORAL_TABLET | Freq: Once | ORAL | Status: DC | PRN

## 2020-02-09 MED ORDER — SOD CITRATE-CITRIC ACID 500-334 MG/5ML PO SOLN
30.0000 mL | ORAL | Status: AC
  Administered 2020-02-09: 30 mL via ORAL
  Filled 2020-02-09: qty 15

## 2020-02-09 MED ORDER — PANTOPRAZOLE SODIUM 40 MG IV SOLR
40.0000 mg | INTRAVENOUS | Status: DC
  Administered 2020-02-09 – 2020-02-10 (×2): 40 mg via INTRAVENOUS
  Filled 2020-02-09 (×2): qty 40

## 2020-02-09 MED ORDER — METOCLOPRAMIDE HCL 5 MG/ML IJ SOLN
INTRAMUSCULAR | Status: DC | PRN
  Administered 2020-02-09: 10 mg via INTRAVENOUS

## 2020-02-09 MED ORDER — DIBUCAINE (PERIANAL) 1 % EX OINT: 1.0000 "application " | TOPICAL_OINTMENT | CUTANEOUS | Status: DC | PRN

## 2020-02-09 MED ORDER — KETOROLAC TROMETHAMINE 30 MG/ML IJ SOLN
30.0000 mg | Freq: Four times a day (QID) | INTRAMUSCULAR | Status: AC
  Administered 2020-02-09 – 2020-02-10 (×4): 30 mg via INTRAVENOUS
  Filled 2020-02-09 (×4): qty 1

## 2020-02-09 MED ORDER — DIPHENHYDRAMINE HCL 25 MG PO CAPS
25.0000 mg | ORAL_CAPSULE | Freq: Four times a day (QID) | ORAL | Status: DC | PRN
  Administered 2020-02-09: 25 mg via ORAL
  Filled 2020-02-09: qty 1

## 2020-02-09 MED ORDER — DEXAMETHASONE SODIUM PHOSPHATE 10 MG/ML IJ SOLN
INTRAMUSCULAR | Status: DC | PRN
  Administered 2020-02-09: 10 mg via INTRAVENOUS

## 2020-02-09 MED ORDER — HYDROMORPHONE HCL 1 MG/ML IJ SOLN: 1.0000 mg | INTRAMUSCULAR | Status: DC | PRN

## 2020-02-09 MED ORDER — OXYTOCIN-SODIUM CHLORIDE 30-0.9 UT/500ML-% IV SOLN
INTRAVENOUS | Status: DC | PRN
  Administered 2020-02-09: 30 [IU] via INTRAVENOUS

## 2020-02-09 MED ORDER — COCONUT OIL OIL
1.0000 "application " | TOPICAL_OIL | Status: DC | PRN
  Administered 2020-02-09: 1 via TOPICAL

## 2020-02-09 MED ORDER — SIMETHICONE 80 MG PO CHEW
80.0000 mg | CHEWABLE_TABLET | ORAL | Status: DC
  Administered 2020-02-10 – 2020-02-12 (×3): 80 mg via ORAL
  Filled 2020-02-09 (×3): qty 1

## 2020-02-09 MED ORDER — MENTHOL 3 MG MT LOZG: 1.0000 | LOZENGE | OROMUCOSAL | Status: DC | PRN

## 2020-02-09 MED ORDER — FENTANYL CITRATE (PF) 100 MCG/2ML IJ SOLN
INTRAMUSCULAR | Status: DC | PRN
Start: 2020-02-09 — End: 2020-02-09
  Administered 2020-02-09: 15 ug via INTRATHECAL

## 2020-02-09 MED ORDER — ACETAMINOPHEN 500 MG PO TABS
1000.0000 mg | ORAL_TABLET | Freq: Four times a day (QID) | ORAL | Status: DC
  Administered 2020-02-09 – 2020-02-12 (×11): 1000 mg via ORAL
  Filled 2020-02-09 (×13): qty 2

## 2020-02-09 MED ORDER — LACTATED RINGERS IV SOLN: INTRAVENOUS | Status: DC | PRN

## 2020-02-09 MED ORDER — MEPERIDINE HCL 25 MG/ML IJ SOLN: 6.2500 mg | INTRAMUSCULAR | Status: DC | PRN

## 2020-02-09 MED ORDER — IBUPROFEN 600 MG PO TABS: 600.0000 mg | ORAL_TABLET | Freq: Four times a day (QID) | ORAL | Status: DC | PRN

## 2020-02-09 MED ORDER — LACTATED RINGERS IV SOLN: INTRAVENOUS | Status: DC

## 2020-02-09 MED ORDER — HYDRALAZINE HCL 20 MG/ML IJ SOLN: 10.0000 mg | INTRAMUSCULAR | Status: DC | PRN

## 2020-02-09 MED ORDER — DEXAMETHASONE SODIUM PHOSPHATE 10 MG/ML IJ SOLN
INTRAMUSCULAR | Status: AC
  Filled 2020-02-09: qty 1

## 2020-02-09 MED ORDER — PHENYLEPHRINE HCL-NACL 20-0.9 MG/250ML-% IV SOLN
INTRAVENOUS | Status: AC
  Filled 2020-02-09: qty 250

## 2020-02-09 MED ORDER — MEPERIDINE HCL 25 MG/ML IJ SOLN
INTRAMUSCULAR | Status: AC
  Filled 2020-02-09: qty 1

## 2020-02-09 MED ORDER — SIMETHICONE 80 MG PO CHEW: 80.0000 mg | CHEWABLE_TABLET | ORAL | Status: DC | PRN

## 2020-02-09 MED ORDER — BUPIVACAINE HCL (PF) 0.5 % IJ SOLN
INTRAMUSCULAR | Status: AC
  Filled 2020-02-09: qty 30

## 2020-02-09 MED ORDER — HYDROMORPHONE HCL 1 MG/ML IJ SOLN
INTRAMUSCULAR | Status: AC
  Filled 2020-02-09: qty 0.5

## 2020-02-09 MED ORDER — NALBUPHINE HCL 10 MG/ML IJ SOLN
5.0000 mg | INTRAMUSCULAR | Status: DC | PRN
  Administered 2020-02-09 – 2020-02-10 (×3): 5 mg via INTRAVENOUS
  Filled 2020-02-09 (×3): qty 1

## 2020-02-09 MED ORDER — HYDRALAZINE HCL 20 MG/ML IJ SOLN
5.0000 mg | INTRAMUSCULAR | Status: DC | PRN
  Administered 2020-02-09: 5 mg via INTRAVENOUS
  Filled 2020-02-09: qty 1

## 2020-02-09 MED ORDER — SENNOSIDES-DOCUSATE SODIUM 8.6-50 MG PO TABS
2.0000 | ORAL_TABLET | ORAL | Status: DC
  Administered 2020-02-10 – 2020-02-12 (×3): 2 via ORAL
  Filled 2020-02-09 (×3): qty 2

## 2020-02-09 MED ORDER — BUPIVACAINE IN DEXTROSE 0.75-8.25 % IT SOLN
INTRATHECAL | Status: DC | PRN
  Administered 2020-02-09: 1.8 mL via INTRATHECAL

## 2020-02-09 MED ORDER — OXYCODONE HCL 5 MG PO TABS
5.0000 mg | ORAL_TABLET | ORAL | Status: DC | PRN
  Administered 2020-02-09 – 2020-02-12 (×9): 5 mg via ORAL
  Filled 2020-02-09: qty 1
  Filled 2020-02-09: qty 2
  Filled 2020-02-09 (×7): qty 1

## 2020-02-09 MED ORDER — OXYCODONE HCL 5 MG/5ML PO SOLN: 5.0000 mg | Freq: Once | ORAL | Status: DC | PRN

## 2020-02-09 MED ORDER — CEFAZOLIN SODIUM-DEXTROSE 2-4 GM/100ML-% IV SOLN
2.0000 g | INTRAVENOUS | Status: AC
  Administered 2020-02-09: 2 g via INTRAVENOUS
  Filled 2020-02-09: qty 100

## 2020-02-09 MED ORDER — FENTANYL CITRATE (PF) 100 MCG/2ML IJ SOLN
INTRAMUSCULAR | Status: AC
  Filled 2020-02-09: qty 2

## 2020-02-09 MED ORDER — SIMETHICONE 80 MG PO CHEW
80.0000 mg | CHEWABLE_TABLET | Freq: Three times a day (TID) | ORAL | Status: DC
  Administered 2020-02-09 – 2020-02-12 (×10): 80 mg via ORAL
  Filled 2020-02-09 (×9): qty 1

## 2020-02-09 MED ORDER — PHENYLEPHRINE HCL-NACL 20-0.9 MG/250ML-% IV SOLN
INTRAVENOUS | Status: DC | PRN
  Administered 2020-02-09: 60 ug/min via INTRAVENOUS

## 2020-02-09 MED ORDER — MORPHINE SULFATE (PF) 0.5 MG/ML IJ SOLN
INTRAMUSCULAR | Status: DC | PRN
Start: 2020-02-09 — End: 2020-02-09
  Administered 2020-02-09: .15 mg via INTRATHECAL

## 2020-02-09 MED ORDER — LABETALOL HCL 5 MG/ML IV SOLN: 20.0000 mg | INTRAVENOUS | Status: DC | PRN

## 2020-02-09 MED ORDER — NALBUPHINE HCL 10 MG/ML IJ SOLN: 5.0000 mg | Freq: Once | INTRAMUSCULAR | Status: DC | PRN

## 2020-02-09 MED ORDER — SODIUM CHLORIDE 0.9 % IV SOLN: INTRAVENOUS | Status: DC | PRN

## 2020-02-09 MED ORDER — ONDANSETRON HCL 4 MG/2ML IJ SOLN
INTRAMUSCULAR | Status: AC
  Filled 2020-02-09: qty 2

## 2020-02-09 MED ORDER — ZOLPIDEM TARTRATE 5 MG PO TABS: 5.0000 mg | ORAL_TABLET | Freq: Every evening | ORAL | Status: DC | PRN

## 2020-02-09 MED ORDER — BUPIVACAINE HCL 0.5 % IJ SOLN
INTRAMUSCULAR | Status: DC | PRN
  Administered 2020-02-09: 30 mL

## 2020-02-09 MED ORDER — OXYTOCIN-SODIUM CHLORIDE 30-0.9 UT/500ML-% IV SOLN: 2.5000 [IU]/h | INTRAVENOUS | Status: AC

## 2020-02-09 MED ORDER — MEPERIDINE HCL 25 MG/ML IJ SOLN
INTRAMUSCULAR | Status: DC | PRN
  Administered 2020-02-09: 12.5 mg via INTRAVENOUS

## 2020-02-09 MED ORDER — NALBUPHINE HCL 10 MG/ML IJ SOLN: 5.0000 mg | INTRAMUSCULAR | Status: DC | PRN

## 2020-02-09 MED ORDER — ONDANSETRON HCL 4 MG/2ML IJ SOLN: 4.0000 mg | Freq: Once | INTRAMUSCULAR | Status: DC | PRN

## 2020-02-09 MED ORDER — ONDANSETRON HCL 4 MG/2ML IJ SOLN
INTRAMUSCULAR | Status: DC | PRN
  Administered 2020-02-09: 4 mg via INTRAVENOUS

## 2020-02-09 MED ORDER — SODIUM CHLORIDE 0.9 % IR SOLN
Status: DC | PRN
  Administered 2020-02-09: 1

## 2020-02-09 MED ORDER — LABETALOL HCL 5 MG/ML IV SOLN: 40.0000 mg | INTRAVENOUS | Status: DC | PRN

## 2020-02-09 MED ORDER — NIFEDIPINE ER OSMOTIC RELEASE 30 MG PO TB24
60.0000 mg | ORAL_TABLET | Freq: Every day | ORAL | Status: DC
  Administered 2020-02-10 – 2020-02-12 (×3): 60 mg via ORAL
  Filled 2020-02-09 (×3): qty 2

## 2020-02-09 MED ORDER — NIFEDIPINE ER OSMOTIC RELEASE 30 MG PO TB24: 30.0000 mg | ORAL_TABLET | Freq: Every day | ORAL | Status: DC

## 2020-02-09 MED ORDER — TETANUS-DIPHTH-ACELL PERTUSSIS 5-2.5-18.5 LF-MCG/0.5 IM SUSP: 0.5000 mL | Freq: Once | INTRAMUSCULAR | Status: DC

## 2020-02-09 MED ORDER — PRENATAL MULTIVITAMIN CH
1.0000 | ORAL_TABLET | Freq: Every day | ORAL | Status: DC
  Administered 2020-02-09 – 2020-02-11 (×3): 1 via ORAL
  Filled 2020-02-09 (×2): qty 1

## 2020-02-09 MED ORDER — MORPHINE SULFATE (PF) 0.5 MG/ML IJ SOLN
INTRAMUSCULAR | Status: AC
Start: 2020-02-09 — End: ?
  Filled 2020-02-09: qty 10

## 2020-02-09 MED ORDER — HYDROMORPHONE HCL 1 MG/ML IJ SOLN
0.2500 mg | INTRAMUSCULAR | Status: DC | PRN
  Administered 2020-02-09: 0.5 mg via INTRAVENOUS

## 2020-02-09 SURGICAL SUPPLY — 37 items
BENZOIN TINCTURE PRP APPL 2/3 (GAUZE/BANDAGES/DRESSINGS) ×4 IMPLANT
CHLORAPREP W/TINT 26ML (MISCELLANEOUS) ×4 IMPLANT
CLAMP CORD UMBIL (MISCELLANEOUS) IMPLANT
CLOSURE WOUND 1/2 X4 (GAUZE/BANDAGES/DRESSINGS) ×1
CLOTH BEACON ORANGE TIMEOUT ST (SAFETY) ×4 IMPLANT
DRSG OPSITE POSTOP 4X10 (GAUZE/BANDAGES/DRESSINGS) ×4 IMPLANT
ELECT REM PT RETURN 9FT ADLT (ELECTROSURGICAL) ×4
ELECTRODE REM PT RTRN 9FT ADLT (ELECTROSURGICAL) ×2 IMPLANT
EXTRACTOR VACUUM KIWI (MISCELLANEOUS) IMPLANT
GLOVE BIO SURGEON STRL SZ 6.5 (GLOVE) ×3 IMPLANT
GLOVE BIO SURGEONS STRL SZ 6.5 (GLOVE) ×1
GLOVE BIOGEL PI IND STRL 7.0 (GLOVE) ×4 IMPLANT
GLOVE BIOGEL PI INDICATOR 7.0 (GLOVE) ×4
GOWN STRL REUS W/TWL LRG LVL3 (GOWN DISPOSABLE) ×12 IMPLANT
KIT ABG SYR 3ML LUER SLIP (SYRINGE) IMPLANT
LIGASURE IMPACT 36 18CM CVD LR (INSTRUMENTS) ×4 IMPLANT
NEEDLE HYPO 22GX1.5 SAFETY (NEEDLE) IMPLANT
NEEDLE HYPO 25X5/8 SAFETYGLIDE (NEEDLE) IMPLANT
NS IRRIG 1000ML POUR BTL (IV SOLUTION) ×4 IMPLANT
PACK C SECTION WH (CUSTOM PROCEDURE TRAY) ×4 IMPLANT
PAD OB MATERNITY 4.3X12.25 (PERSONAL CARE ITEMS) ×4 IMPLANT
PENCIL SMOKE EVAC W/HOLSTER (ELECTROSURGICAL) ×4 IMPLANT
RTRCTR C-SECT PINK 25CM LRG (MISCELLANEOUS) ×4 IMPLANT
STRIP CLOSURE SKIN 1/2X4 (GAUZE/BANDAGES/DRESSINGS) ×3 IMPLANT
SUT CHROMIC 2 0 CT 1 (SUTURE) ×8 IMPLANT
SUT MNCRL 0 VIOLET CTX 36 (SUTURE) ×4 IMPLANT
SUT MONOCRYL 0 CTX 36 (SUTURE) ×4
SUT PDS AB 0 CTX 60 (SUTURE) ×4 IMPLANT
SUT PLAIN 2 0 (SUTURE) ×2
SUT PLAIN ABS 2-0 CT1 27XMFL (SUTURE) ×2 IMPLANT
SUT VIC AB 0 CTX 36 (SUTURE) ×4
SUT VIC AB 0 CTX36XBRD ANBCTRL (SUTURE) ×4 IMPLANT
SUT VIC AB 4-0 KS 27 (SUTURE) ×4 IMPLANT
SYR CONTROL 10ML LL (SYRINGE) IMPLANT
TOWEL OR 17X24 6PK STRL BLUE (TOWEL DISPOSABLE) ×4 IMPLANT
TRAY FOLEY W/BAG SLVR 14FR LF (SET/KITS/TRAYS/PACK) ×4 IMPLANT
WATER STERILE IRR 1000ML POUR (IV SOLUTION) ×4 IMPLANT

## 2020-02-09 NOTE — Anesthesia Preprocedure Evaluation (Signed)
Anesthesia Evaluation  Patient identified by MRN, date of birth, ID band Patient awake    Reviewed: Allergy & Precautions, H&P , NPO status , Patient's Chart, lab work & pertinent test results  History of Anesthesia Complications Negative for: history of anesthetic complications  Airway Mallampati: II  TM Distance: >3 FB Neck ROM: full    Dental no notable dental hx.    Pulmonary neg pulmonary ROS,    Pulmonary exam normal        Cardiovascular hypertension, Normal cardiovascular exam     Neuro/Psych negative neurological ROS  negative psych ROS   GI/Hepatic negative GI ROS, Neg liver ROS,   Endo/Other  negative endocrine ROS  Renal/GU negative Renal ROS  negative genitourinary   Musculoskeletal   Abdominal   Peds  Hematology negative hematology ROS (+)   Anesthesia Other Findings Pre-eclampsia with severe features and severe range blood pressures. New onset of CP.   Reproductive/Obstetrics (+) Pregnancy                             Anesthesia Physical Anesthesia Plan  ASA: III and emergent  Anesthesia Plan: Spinal   Post-op Pain Management:    Induction:   PONV Risk Score and Plan: 3 and Ondansetron and Treatment may vary due to age or medical condition  Airway Management Planned: Natural Airway  Additional Equipment: None  Intra-op Plan:   Post-operative Plan:   Informed Consent: I have reviewed the patients History and Physical, chart, labs and discussed the procedure including the risks, benefits and alternatives for the proposed anesthesia with the patient or authorized representative who has indicated his/her understanding and acceptance.       Plan Discussed with:   Anesthesia Plan Comments: (Patient had last meal at 6:45 PM, however, per surgeon this is an urgent case and cannot wait an additional 2 hours given the escalation of her symptoms despite medical  management. I discussed with the patient the very small but slightly increased risk of aspiration given her NPO time of <8 hours, and she voiced her understanding.)        Anesthesia Quick Evaluation

## 2020-02-09 NOTE — Op Note (Signed)
Cesarean Section Procedure Note  Indications: previous uterine incision kerr x 1 with preeclampsia with severe features uncontrolled at 30 weeks 2 days.   Pre-operative Diagnosis: 30 week 2 day pregnancy, prior Low transverse cesarean             Section preeclampsia with severe features, desires permanent sterilization  Post-operative Diagnosis: same  Surgeon: Caffie Damme MD  Assistants: Gavin Potters, CNM  Anesthesia: Spinal anesthesia  ASA Class: 2  Procedure Details   The patient was counseled about the risks, benefits, complications of the cesarean section. The patient concurred with the proposed plan, giving informed consent.  The site of surgery properly noted/marked. The patient was taken to Operating Room # C, identified as Sarah Berry and the procedure verified as C-Section Delivery. A Time Out was held and the above information confirmed.  After spinal was found to adequate , the patient was placed in the dorsal supine position with a leftward tilt, draped and prepped in the usual sterile manner. A Pfannenstiel incision was made and carried down through the subcutaneous tissue to the fascia.  The fascia was incised in the midline and the fascial incision was extended laterally with Mayo scissors. The superior aspect of the fascial incision was grasped with Coker clamps x2, tented up and the rectus muscles dissected off sharply with the scalpel. The rectus was then dissected off with blunt dissection and Mayo scissors inferiorly. The rectus muscles were separated in the midline. The abdominal peritoneum was identified, tented up between two hemostats, entered sharply using Metzenbaum scissors, and the incision was extended superiorly and inferiorly with good visualization of the bladder. The Alexis retractor was then deployed. The vesicouterine peritoneum was identified, tented up, entered sharply with Metzenbaum scissors, and the bladder flap was created digitally. Scalpel  was then used to make a low transverse incision on the uterus which was extended laterally with  blunt dissection. The fetal vertex was identified, the head was brought out from the pelvis. The head was then delivered easily through the uterine incision followed by the body. The A live female infant was bulb suctioned on the operative field cried vigorously, cord was clamped and cut and the infant was passed to the waiting neonatologist. Apgars 7/9. Cord gases and cord blood obtained. Placenta was then delivered spontaneously, intact and appear normal, the uterus was cleared of all clot and debris. The uterine incision was repaired with #0 Monocryl in running locked fashion. A second imbricating suture was performed using the same suture. The incision was hemostatic. Ovaries and tubes were inspected and normal. The abdominal cavity was cleared of all clot and debris.   Attention was then turned to the fallopian tubes.  The left fallopian tube was traced to the fimbriated end and held with the [facility].  The Ligasure Impact was used to cauterize the mesosalpinx and transect off the tubal segment from the uterus.  The tube was handed off the field to be sent to Pathology.  The above was done for the contralateral right fallopian tube.  The Alexis retractor was removed. The abdominal peritoneum was reapproximated with 2-0 chromic  in a running fashion, the rectus muscles was reapproximated with #2 chromic in interrupted fashion. The fascia was closed with looped 0 PDS in a running fashion. The subcuticular layer was irrigated and all bleeders cauterized.    The incision was injected with 25 mL of 0.5% Marcaine. Interrupted sutures of 2-0 plain were used to re-approximate Scarpas fascia.  The skin was  closed with 4-0 vicryl in a subcuticular fashion using a Lanny Hurst needle. The incision was dressed with benzoine, steri strips and a honeycomb dressing. All sponge lap and needle counts were correct x3 (instrument, sponge,  and needle counts were correct prior the abdominal closure and at the conclusion of the case.) Patient tolerated the procedure well and recovered in stable condition following the procedure.  Findings: Live female infant, Apgars 7/9, clear amniotic fluid, normal appearing placenta, normal uterus, bilateral tubes and ovaries  Estimated Blood Loss: 190 mL  IVF: 170 mL         Drains: Foley catheter  Urine output: 150 mL clear urine         Specimens: Placenta to Pathology; bilateral fallopian tubes         Implants: none         Complications:  None; patient tolerated the procedure well.         Disposition: PACU - hemodynamically stable.   Sarah Berry

## 2020-02-09 NOTE — Progress Notes (Addendum)
Sarah Berry [id number] Postpartum Postoperative Day #  [address] [id number], [redacted]w[redacted]d, S/P Reapeat LT Cesarean Section due to Preeclampsia with severe features. Pt had HA, and uncontrolled severe range BP, had received IV labetalol twice, was on po labetalol up to 400mg  TID, then switched to 60mg  xl procardia due to uncontrolled BP on 9/30 and pt request due to procardia worked for her in the past. Pt maintained a low toned orbital HA, that did resolve at times with Fioricet. PCR was 0.55 on 9/26, resolved to 0.13 on 9/27. Currently asymptomatic and BP is 158/96. Pt is s/p 24 hours PP mag at 0100 on 10/3. And restarted on 60mg  xl procardia. She received 48 hours of magnesium for neuropotential upon admission and was BMZ completed. Had Elevated CBG but PP has been WNL.   Subjective: Patient up ad lib, denies syncope or dizziness. Reports consuming regular diet without issues and denies N/V. Patient reports 0 bowel movement + passing flatus.  Denies issues with urination and reports bleeding is "lighter."  Patient is breastfeeding and reports going well.  Received BTL during surgery for postpartum contraception.  Pain is being appropriately managed with use of po meds. Denies HA, RUQ pain or vision changes. Was restarted on procardia 30gm xl po daily today, s/p 24 hour PP mag @ 0100. Pt required IV hydralazine reported by RN last night, which improved.    Objective: Patient Vitals for the past 24 hrs:  BP Temp Temp src Pulse Resp SpO2  02/10/20 0838 (!) 158/96 98.4 F (36.9 C) Oral 80 18 99 %  02/10/20 0404 137/88 98.2 F (36.8 C) Oral 75 16 98 %  02/10/20 0000 (!) 156/92 -- -- 83 20 98 %  02/09/20 2357 (!) 158/95 -- -- 84 20 --  02/09/20 2355 -- -- -- -- -- 97 %  02/09/20 2350 -- -- -- -- -- 96 %  02/09/20 2345 -- -- -- -- -- 96 %  02/09/20 2340 (!) 165/101 -- -- 79 -- 97 %  02/09/20 2316 (!) 172/103 98.4 F (36.9 C) Oral 83 18 98 %  02/09/20 2300 -- -- -- -- 18 --  02/09/20 2200 -- -- -- --  18 --  02/09/20 2100 -- -- -- -- 18 --  02/09/20 1943 (!) 150/97 98 F (36.7 C) Oral 73 20 96 %  02/09/20 1900 -- -- -- -- 18 --  02/09/20 1800 -- -- -- -- 18 --  02/09/20 1700 -- -- -- -- 18 --  02/09/20 1607 (!) 154/99 97.8 F (36.6 C) Oral 80 18 96 %  02/09/20 1600 -- -- -- -- 18 --  02/09/20 1500 -- -- -- -- 18 --  02/09/20 1400 -- -- -- -- 18 --  02/09/20 1300 -- -- -- -- 18 --  02/09/20 1200 -- -- -- -- 18 --  02/09/20 1125 (!) 146/104 97.8 F (36.6 C) Oral 90 18 94 %  02/09/20 1100 -- -- -- -- 18 --  02/09/20 1000 -- -- -- -- 18 --     Physical Exam:  General: alert, cooperative, appears stated age and no distress Mood/Affect: Happy Lungs: clear to auscultation, no wheezes, rales or rhonchi, symmetric air entry.  Heart: normal rate, regular rhythm, normal S1, S2, no murmurs, rubs, clicks or gallops. Breast: breasts appear normal, no suspicious masses, no skin or nipple changes or axillary nodes. Abdomen:  + bowel sounds, soft, non-tender Incision: healing well, no significant drainage, no dehiscence, no significant erythema, Honeycomb dressing  Uterine Fundus: firm, involution -1 Lochia: appropriate Skin: Warm, Dry. DVT Evaluation: No evidence of DVT seen on physical exam. Negative Homan's sign. No cords or calf tenderness. Generalized edema to upper and lower body and face.   Labs: Recent Labs    02/08/20 2123 02/09/20 0706 02/10/20 0510  HGB 11.7* 12.6 11.1*  HCT 35.6* 37.2 33.5*  WBC 13.3* 20.1* 11.3*    CBG (last 3)  Recent Labs    02/08/20 2048 02/09/20 0014 02/09/20 0208  GLUCAP 119* 85 99     I/O: I/O last 3 completed shifts: In: 5351.8 [P.O.:1740; I.V.:3611.8] Out: 1610 [Urine:5075; Blood:212]   Assessment Postpartum Postoperative Day #  1 Sarah Berry, [id number], [redacted]w[redacted]d, S/P Reapeat LT Cesarean Section due to Preeclampsia with severe features. Pt had HA, and uncontrolled severe range BP, had received IV labetalol twice, was on po labetalol  up to 400mg  TID, then switched to 60mg  xl procardia due to uncontrolled BP on 9/30 and pt request due to procardia worked for her in the past. Pt maintained a low toned orbital HA, that did resolve at times with Fioricet. PCR was 0.55 on 9/26, resolved to 0.13 on 9/27. Currently asymptomatic and BP is 158/96. Pt is s/p 24 hours PP mag at 0100 on 10/3. Currently on procardia 60mg  xl po daily. Last night required IV hydralazine protocol for severe rang e Bps.  She received 48 hours of magnesium for neuropotential upon admission and was BMZ completed. Had Elevated CBG but PP has been WNL.  Pt stable. -1 [employer]olution. BreastFeeding. Hemodynamically Stable with hgb increase from 11.7-12.6, with surgical QBL of 178mls.  Plan: Continue other mgmt as ordered PreE with SF: monitor BP, continue procardia 60mg  xl po , if BP >150/90s will increase procardia to 90mg  daily. F/U 1 weeks PP for Bp check with [facility].  And hydralazine protocol for severe range. BP >160/110. Added 25 og HCTZ today for generalized edema and increased BP to regimen.  VTE Prophylactics: SCD, ambulated as tolerates.  Pain control: Motrin/Tylenol/Narcotics PRN Breastfeeding, Lactation consult and Contraception BTL performed during hospital stayt in pt.   Dr. Alwyn Pea to be updated on patient status  [facility] NP-C, CNM 02/10/2020, 9:35 AM

## 2020-02-09 NOTE — Anesthesia Procedure Notes (Signed)
Spinal  Patient location during procedure: OR Staffing Performed: anesthesiologist  Anesthesiologist: Jaylynn Mcaleer E, MD Preanesthetic Checklist Completed: patient identified, IV checked, risks and benefits discussed, surgical consent, monitors and equipment checked, pre-op evaluation and timeout performed Spinal Block Patient position: sitting Prep: DuraPrep and site prepped and draped Patient monitoring: continuous pulse ox, blood pressure and heart rate Approach: midline Location: L3-4 Injection technique: single-shot Needle Needle type: Pencan  Needle gauge: 24 G Needle length: 9 cm Additional Notes Functioning IV was confirmed and monitors were applied. Sterile prep and drape, including hand hygiene and sterile gloves were used. The patient was positioned and the spine was prepped. The skin was anesthetized with lidocaine.  Free flow of clear CSF was obtained prior to injecting local anesthetic into the CSF. The needle was carefully withdrawn. The patient tolerated the procedure well.      

## 2020-02-09 NOTE — Transfer of Care (Signed)
Immediate Anesthesia Transfer of Care Note  Patient: Sarah Berry  Procedure(s) Performed: CESAREAN SECTION (N/A ) BILATERAL TUBAL LIGATION (Bilateral )  Patient Location: PACU  Anesthesia Type: Spinal  Level of Consciousness: awake, alert  and oriented  Airway & Oxygen Therapy: Patient Spontanous Breathing  Post-op Assessment: Report given to RN and Post -op Vital signs reviewed and stable  Post vital signs: Reviewed and stable  Last Vitals:  Vitals Value Taken Time  BP 142/94 02/09/20 0200  Temp    Pulse 84 02/09/20 0200  Resp 14 02/09/20 0200  SpO2 100 % 02/09/20 0200  Vitals shown include unvalidated device data.  Last Pain:  Vitals:   02/08/20 2146  TempSrc: Oral  PainSc:       Patients Stated Pain Goal: 2 (56/25/63 8937)  Complications: No complications documented.

## 2020-02-09 NOTE — Anesthesia Postprocedure Evaluation (Signed)
Anesthesia Post Note  Patient: Energy manager  Procedure(s) Performed: CESAREAN SECTION (N/A ) BILATERAL TUBAL LIGATION (Bilateral )     Patient location during evaluation: PACU Anesthesia Type: Spinal Level of consciousness: oriented and awake and alert Pain management: pain level controlled Vital Signs Assessment: post-procedure vital signs reviewed and stable Respiratory status: spontaneous breathing, respiratory function stable and nonlabored ventilation Cardiovascular status: blood pressure returned to baseline and stable Postop Assessment: no headache, no backache, no apparent nausea or vomiting and spinal receding Anesthetic complications: no   No complications documented.  Last Vitals:  Vitals:   02/09/20 1700 02/09/20 1800  BP:    Pulse:    Resp: 18 18  Temp:    SpO2:      Last Pain:  Vitals:   02/09/20 1607  TempSrc: Oral  PainSc:    Pain Goal: Patients Stated Pain Goal: 2 (02/07/20 2250)                 Lidia Collum

## 2020-02-09 NOTE — Progress Notes (Signed)
Subjective: Postpartum Day 0: Cesarean Delivery at 30 + 2 weeks for preeclampsia with severe features.  Patient reports no headache, no scotomata, no blurry vision. She feels foggy, tired.  She is having some abdominal pain. She has not been out of bed yet.   Objective: Vital signs in last 24 hours: Temp:  [97.7 F (36.5 C)-98.4 F (36.9 C)] 97.8 F (36.6 C) (10/02 1125) Pulse Rate:  [73-96] 90 (10/02 1125) Resp:  [11-20] 18 (10/02 1125) BP: (131-185)/(75-113) 146/104 (10/02 1125) SpO2:  [94 %-100 %] 94 % (10/02 1125)  Physical Exam:  General: alert, cooperative and no distress Incision: no significant drainage, pressure dressing in place DVT Evaluation: No evidence of DVT seen on physical exam. SCDs on and running  Recent Labs    02/08/20 2123 02/09/20 0706  HGB 11.7* 12.6  HCT 35.6* 37.2    Assessment/Plan: Post op day 0 Status post repeat cesarean section with bilateral salpingectomy Continue routine post operative care D/C foley Out of bed to chair.  Will advance diet to Regular IV toradol then motrin Will write for IV pain medication for breakthrough pain  Medardo Hassing 02/09/2020, 11:45 AM

## 2020-02-09 NOTE — Progress Notes (Signed)
MD Pre Operative Note   Went to patient bedside.  She is having severe headache and now chest pain.  Blood pressures 160's / 100;s   Discussed with patient the recommendation to proceed with a repeat cesarean section.  She voiced understanding.  Discussed the risks of surgery to include hemorrhage, infection, needing a blood transfusion,  Cesarean hysterectomy, injury to other internal organs, she understands and agrees to proceed.  Consent signed, witnessed and placed into chart.   Rogelio Seen Sarah Berry

## 2020-02-09 NOTE — Lactation Note (Signed)
This note was copied from a baby's chart. Lactation Consultation Note  Patient Name: Sarah Berry TXMIW'O Date: 02/09/2020 Reason for consult: Initial assessment;Preterm <34wks;NICU baby  38 - 1103 - I conducted an initial lactation consult with Sarah Berry. She has a preterm infant in the NICU. She has a 73.36 year old at home and reported limited lactation support at the hospital, difficulty latching baby, and low milk production. She discontinued pumping at 6 weeks postpartum.  She states that she is very motivated to provide her breast milk. I noted a DEBP set up in the room at the bedside. Baby was 53 hour old, but she had already conducted on pumping session earlier today.   I assisted with pumping. I provided her with a belly band from L&D to hold the flanges in pace. We used size 27 flanges. I showed her the settings on the pump and how to clean her pump supplies.  We also reviewed hand expression. I emphasized the importance of holding baby STS in the NICU when able and pumping following that time. We also discussed benefits of her milk to baby in the NICU.  I provided a NICU booklet and reviewed it. I also recommended downloading a free pumping app to help with recording sessions and reminding her to pump.  I recommended that she pump 8 times a day or every 2-3 hours during the day and every 3-4 hours at night. She verbalized understanding.   Maternal Data Formula Feeding for Exclusion: No Has patient been taught Hand Expression?: Yes Does the patient have breastfeeding experience prior to this delivery?: Yes  Interventions Interventions: Breast feeding basics reviewed;Hand express;Breast massage;DEBP  Lactation Tools Discussed/Used Tools: Pump;Flanges;Other (comment) (belly band - pump bra) Flange Size: 27 Breast pump type: Double-Electric Breast Pump Pump Review: Setup, frequency, and cleaning;Milk Storage Initiated by:: hl Date initiated:: 02/09/20   Consult  Status Consult Status: Follow-up Date: 02/10/20 Follow-up type: In-patient    Lenore Manner 02/09/2020, 1:30 PM

## 2020-02-10 DIAGNOSIS — O152 Eclampsia in the puerperium: Secondary | ICD-10-CM | POA: Diagnosis not present

## 2020-02-10 LAB — CBC WITH DIFFERENTIAL/PLATELET
Abs Immature Granulocytes: 0.07 10*3/uL (ref 0.00–0.07)
Basophils Absolute: 0 10*3/uL (ref 0.0–0.1)
Basophils Relative: 0 %
Eosinophils Absolute: 0.1 10*3/uL (ref 0.0–0.5)
Eosinophils Relative: 1 %
HCT: 33.5 % — ABNORMAL LOW (ref 36.0–46.0)
Hemoglobin: 11.1 g/dL — ABNORMAL LOW (ref 12.0–15.0)
Immature Granulocytes: 1 %
Lymphocytes Relative: 17 %
Lymphs Abs: 2 10*3/uL (ref 0.7–4.0)
MCH: 30.2 pg (ref 26.0–34.0)
MCHC: 33.1 g/dL (ref 30.0–36.0)
MCV: 91.3 fL (ref 80.0–100.0)
Monocytes Absolute: 0.7 10*3/uL (ref 0.1–1.0)
Monocytes Relative: 6 %
Neutro Abs: 8.5 10*3/uL — ABNORMAL HIGH (ref 1.7–7.7)
Neutrophils Relative %: 75 %
Platelets: 173 10*3/uL (ref 150–400)
RBC: 3.67 MIL/uL — ABNORMAL LOW (ref 3.87–5.11)
RDW: 13.5 % (ref 11.5–15.5)
WBC: 11.3 10*3/uL — ABNORMAL HIGH (ref 4.0–10.5)
nRBC: 0 % (ref 0.0–0.2)

## 2020-02-10 LAB — COMPREHENSIVE METABOLIC PANEL
ALT: 42 U/L (ref 0–44)
AST: 28 U/L (ref 15–41)
Albumin: 2.2 g/dL — ABNORMAL LOW (ref 3.5–5.0)
Alkaline Phosphatase: 52 U/L (ref 38–126)
Anion gap: 7 (ref 5–15)
BUN: 12 mg/dL (ref 6–20)
CO2: 22 mmol/L (ref 22–32)
Calcium: 7.3 mg/dL — ABNORMAL LOW (ref 8.9–10.3)
Chloride: 106 mmol/L (ref 98–111)
Creatinine, Ser: 0.69 mg/dL (ref 0.44–1.00)
GFR calc Af Amer: >60 mL/min (ref >60)
GFR calc non Af Amer: >60 mL/min (ref >60)
Glucose, Bld: 98 mg/dL (ref 70–99)
Potassium: 3.7 mmol/L (ref 3.5–5.1)
Sodium: 135 mmol/L (ref 135–145)
Total Bilirubin: 0.2 mg/dL — ABNORMAL LOW (ref 0.3–1.2)
Total Protein: 5.3 g/dL — ABNORMAL LOW (ref 6.5–8.1)

## 2020-02-10 MED ORDER — HYDROCHLOROTHIAZIDE 25 MG PO TABS
25.0000 mg | ORAL_TABLET | Freq: Every day | ORAL | Status: DC
  Administered 2020-02-10 – 2020-02-12 (×3): 25 mg via ORAL
  Filled 2020-02-10 (×3): qty 1

## 2020-02-10 NOTE — Lactation Note (Addendum)
This note was copied from a baby's chart. Lactation Consultation Note  Patient Name: Sarah Berry KXFGH'W Date: 02/10/2020  P2, 40 hour  Preterm infnat with -2% weight loss in NICU. LC ask mom how she was feeling,  per mom, today has been the hardest, everything has just hit her today with infant being in NICU, this is her 2nd C/S LC ask mom would she like to talk with the social worker  Or Chaplain about how she is feeling, she politely declined. Mom explained this is her 10 th day here at the hospital. Per mom, she has a therapist and she talked with her brother as well she has very good family support.  LC stated she understood.  Per mom, she pumped 5 times just recently saw a bead of colostrum, LC praised her for her efforts and suggested mom do hands on pumping, after using the DEBP she can hand express. LC reviewed breast massage with hand expression and mom expressed 1 mls of colostrum. Mom put EBM in Fridge and plans to take to NICU after she finish pumping.  Mom understands that using the DEBP is helping to stimulate and establish her milk supply, LC discussed visualization and relaxation techniques while using the DEBP to help mom relax.  Per mom, she has felt a little rubbing of breast tissue while pumping, LC re-fitted mom with size 30 mm breast flange on her left breast and 27 mm on her right. Mom was feeling much better as she is now seeing colostrum that she can take to her infant, LC praised mom on her breastfeeding efforts. Going forward mom will continue to use DEBP every 3 hours for 15 minutes on initial setting and add hand expressing after pumping. Mom will continue to follow NICU infant feeding policy and procedures. Mom knows to call [facility] services if she has questions or concerns we are available for her.    Maternal Data    Feeding    LATCH Score                   Interventions    Lactation Tools Discussed/Used     Consult Status       Vicente Serene 02/10/2020, 5:14 PM

## 2020-02-11 MED ORDER — PANTOPRAZOLE SODIUM 40 MG PO TBEC
40.0000 mg | DELAYED_RELEASE_TABLET | Freq: Every day | ORAL | Status: DC
  Administered 2020-02-11 – 2020-02-12 (×2): 40 mg via ORAL
  Filled 2020-02-11 (×2): qty 1

## 2020-02-11 NOTE — Lactation Note (Signed)
This note was copied from a baby's chart. Lactation Consultation Note  Patient Name: Sarah Berry IZXYO'F Date: 02/11/2020 Reason for consult: Follow-up assessment   Mother up washing pump parts when LC arrived in the room. Mother reports that she feels better today. Mother request bottles to put milk in so she didn't have to leave her colostrum bottles in the NICU. Father of infant gives update of infants progress and both parents are pleased.   Mother denies having any concerns with pumping . She reports that the flanges feel fine. She also reports that she has an extra pump kit for using in the NICU. Mother is drying parts with paper towel to be sure that water doesn't get into milk.   Mother reports that she is able to get more  colostrum today.   Mother was given more bottles as she request for her pump. Mother deneies any questions or concerns.  Mother is aware of available lC resources , mother is aware she can page LC to see her in the NICU. Encouraged mother to do STS.   Maternal Data    Feeding    LATCH Score                   Interventions    Lactation Tools Discussed/Used     Consult Status Consult Status: Follow-up Date: 02/12/20 Follow-up type: In-patient    Jess Barters [facility] 02/11/2020, 12:35 PM

## 2020-02-11 NOTE — Progress Notes (Addendum)
Subjective: POD# 2 Rpt C/S w/ BTL Information for the patient's newborn:  Sarah Berry, Sarah Berry [[id number]]  female    34 Name Aline Brochure Circumcision 36 wks in NICU  Reports feeling "fearful and humbled" for care of self and infant in NICU. Swelling in hands decreased since yesterday. Denies swelling of BLE.  Feeding: pumpng Reports tolerating PO and denies HA, visual changes, and epigastric pain. Ambulating and urinating w/o difficulty  Pain controlled with PO meds Denies HA/SOB/dizziness  Flatus passing Vaginal bleeding is normal, no clots     Objective:  VS:  Vitals:   02/11/20 1452 02/11/20 1455 02/11/20 1806 02/11/20 1941  BP: (!) 146/92 (!) 146/92 (!) 138/92 (!) 145/96  Pulse: (!) 102 (!) 102 (!) 110 94  Resp: 16  16 16   Temp: 98.1 F (36.7 C)  98.5 F (36.9 C) 98.3 F (36.8 C)  TempSrc: Oral  Oral Oral  SpO2: 97%  97% 98%  Weight:      Height:        Intake/Output Summary (Last 24 hours) at 02/11/2020 1959 Last data filed at 02/11/2020 1400 Gross per 24 hour  Intake 1020 ml  Output 1500 ml  Net -480 ml     Recent Labs    02/09/20 0706 02/10/20 0510  WBC 20.1* 11.3*  HGB 12.6 11.1*  HCT 37.2 33.5*  PLT 173 173    Blood type: --/--/O POS (10/01 2134) Rubella:  immune    Physical Exam:  General: alert, cooperative and no distress CV: cap refill <3 sec Resp: unlabored Abdomen: soft, nontender Incision: clean, dry and intact honeycomb dressing Uterine Fundus: firm, below umbilicus, nontender Lochia: appropriate Ext: no edema, redness or tenderness in the calves or thighs and negative for cords   Assessment/Plan: 34 y.o. [id number] POD# 2 Rpt C/S w/ BTL                   Pre-eclampsia w/ severe features     -continues Procardia 90 mg PO daily    -HCTZ effective for reduction in generalized edema    -Has not used hydralazine protocol for severe range BP  Continue routine post-op PP care          Support for pumping to promote lactation Anticipate D/C  02/12/20   Arrie Eastern, MSN, CNM 02/11/2020  MD Addendum: I reviewed chart and agree with above findings, assessment and plan as outlined above by Arrie Eastern, MSN, CNM.  [name]. 02/11/2020.

## 2020-02-12 DIAGNOSIS — Z8659 Personal history of other mental and behavioral disorders: Secondary | ICD-10-CM

## 2020-02-12 LAB — SURGICAL PATHOLOGY

## 2020-02-12 MED ORDER — HYDROCHLOROTHIAZIDE 25 MG PO TABS: 25.0000 mg | ORAL_TABLET | Freq: Every day | ORAL | 0 refills | Status: DC

## 2020-02-12 MED ORDER — NIFEDIPINE ER OSMOTIC RELEASE 30 MG PO TB24: 60.0000 mg | ORAL_TABLET | Freq: Every day | ORAL | 1 refills | Status: AC

## 2020-02-12 MED ORDER — OXYCODONE HCL 5 MG PO TABS
5.0000 mg | ORAL_TABLET | ORAL | 0 refills | Status: DC | PRN
Start: 2020-02-12 — End: 2022-03-12

## 2020-02-12 MED ORDER — SERTRALINE HCL 50 MG PO TABS: 50.0000 mg | ORAL_TABLET | Freq: Every day | ORAL | 1 refills | Status: AC

## 2020-02-12 NOTE — Progress Notes (Signed)
Discharge instructions and prescriptions given to pt. Discussed post-op c-section care, signs and symptoms to report to the MD, upcoming appointments, and meds. Pt verbalizes understanding and has no questions or concerns at this time. Notified social worker, Abundio Miu, regarding pt's [place] score of 10. Social worker said she will follow up with her outpatient. Pt discharged from hospital in stable condition.

## 2020-02-12 NOTE — Lactation Note (Signed)
This note was copied from a baby's chart. Lactation Consultation Note  Patient Name: Sarah Berry IHKVQ'Q Date: 02/12/2020 Reason for consult: Follow-up assessment  0956 - 1026 - I followed up with Ms. Boltz today. She had just finished pumping upon entry. She is pumping about 20-30 mls combined at 81 hours postpartum. She reports that her breasts are filling, and she has had some "nodules" which she has been able to successfully address via hands on pumping and massage. We discussed ways to treat engorgement including use of ice after pumping and warm moist heat just prior to or during pumping.  [name] is likely to be discharged today. Baby is currently NPO. She will likely go home to sleep at night until she has healed from her C/S, but her goal is to spend a good amount of time in the NICU and to breast feed baby when he is developmentally ready.  We discussed her pump settings (reviewed), milk storage, and pumping frequency. I praised her for her hard work, and offered encouragement regarding the situation with her baby (that she is helping her baby, as she admits to some feelings of helplessness).  [name] is going to obtain her private insurance pump; she has a specific pump in mind. I made her aware of our Stork Pump option, but she'd prefer the pump via insurance. She states that she is going to rent a pump from the gift shop until that arrives, and she has a used Medela P.I.S. at home as a backup.  All questions answered at this time. Lactation to follow up in the NICU. [name] is aware of our continued services in the NICU.  She will continue to pump 8+ times a day (both breasts) and will hold baby STS when he is medically stable.  Interventions Interventions: Breast feeding basics reviewed;DEBP  Lactation Tools Discussed/Used Tools: Pump Breast pump type: Double-Electric Breast Pump Pump Review: Setup, frequency, and cleaning;Milk Storage   Consult  Status Consult Status: Follow-up Date: 02/15/20 Follow-up type: In-patient    Lenore Manner 02/12/2020, 10:30 AM

## 2020-02-12 NOTE — Progress Notes (Signed)
CSW acknowledged that MOB Edinburgh score was a 10. CSW attempted to meet with MOB at infant's bedside, however MOB and FOB were in route to exit the unit.  CSW provided the couple with CSW's contact information and requested that the couple call tomorrow when they come to visit with infant; they agreed. CSW will complete clinical assessment at a later time and will provide PMAD education.   Laurey Arrow, MSW, LCSW Clinical Social Work [phone]

## 2020-02-12 NOTE — Discharge Summary (Signed)
RCS OB Discharge Summary     Patient Name: Sarah Berry DOB: 08-09-1985 MRN: [id number]  Date of admission: 02/03/2020 Delivering MD: Sanjuana Kava  Date of delivery: 02/09/2020 Type of delivery: RCS  Newborn Data: Sex: Baby Female Circumcision: In pt but newborn born at 73.2 in NICU, intubated with pneumothorax and chest tube.  Live born female  Birth Weight: 3 lb 9.5 oz (1630 g) Berry: 7, 9  Newborn Delivery   Birth date/time: 02/09/2020 01:03:00 Delivery type: C-Section, Low Transverse Trial of labor: No C-section categorization: Repeat      Feeding: Pumping  Infant being discharge to home with mother in stable condition.   Admitting diagnosis: Preeclampsia, third trimester [O14.93] Intrauterine pregnancy: [redacted]w[redacted]d     Secondary diagnosis:  Principal Problem:   Postpartum care following cesarean delivery 10/2 Active Problems:   Preeclampsia, third trimester   Status post repeat low transverse cesarean section 10/2   Newborn product of IVF pregnancy   History of postpartum depression   History of anxiety                                Complications: Preeclampsia with severe features dx by Dr Alwyn Pea.                                                              Intrapartum Procedures: cesarean: low cervical, transverse and tubal ligation Postpartum Procedures: [id number] PP mag Complications-Operative and Postpartum: continued 24 hours PP magnesium with labile BPS.  Augmentation: AROM and at time of delivery    History of Present Illness: Ms. Sarah Berry is a 34 y.o. female, [phone], who presents at [redacted]w[redacted]d weeks gestation. The patient has been followed at  [facility]  Her pregnancy has been complicated by:  Patient Active Problem List   Diagnosis Date Noted  . History of postpartum depression 02/12/2020  . History of anxiety 02/12/2020  . Status post repeat low transverse cesarean section 10/2 02/09/2020  . Postpartum care following  cesarean delivery 10/2 02/09/2020  . Newborn product of IVF pregnancy 02/09/2020  . Preeclampsia, third trimester 02/03/2020    Hospital course:  Sceduled C/S   34 y.o. yo G3P0212 at [redacted]w[redacted]d was admitted to the hospital 02/03/2020 for urgent scheduled cesarean section with the following indication:Repeat with preeclampsia with severe features. .Delivery details are as follows:  Membrane Rupture Time/Date: 1:02 AM ,02/09/2020   Delivery Method:C-Section, Low Transverse  Details of operation can be found in separate operative note.  Patient had an uncomplicated postpartum course.  She is ambulating, tolerating a regular diet, passing flatus, and urinating well. Patient is discharged home in stable condition on  02/12/20        Newborn Data: Birth date:02/09/2020  Birth time:1:03 AM  Gender:Female  Living status:Living  Apgars:7 ,9  Weight:1630 g     Hospital Course--Unscheduled Cesarean:  LOS#9, POD#3: Admitted 9/26. Negative GBS.  Utilized spinal for pain management.   Repeat LTCS with BTL.  Due to worsen if preeclampsia with severe features dx by DR Alwyn Pea and recommended to receive RCS at 30.2, pt had been on labetalol 400mg  TID, then required IV hydralazine, for severe range, then was switched to procardia 60mg  xl, with labile BP  ranges still at times required IV hydralazine, with low toned orbital HA, labs remained unremarkable and PCR on 9/26 was 0.55 upon admission and resoled to 0.13 on 9/27.  She was consented for cesarean, with Dr. Alwyn Pea performing a repeat LTCS under spinal anesthesia, with delivery of a viable baby female, with weight and Apgars as listed above. Infant was intubated and taken to the NICU, MoB was given two doses of BMZ prior to RCS and had recieved 48 hours magnesium during pregnancy for neuroprotectant and mag was restarted prior to RCS, approx 2 hours prior. Pt then continue to received 24 hour mag PP. Pt still require hydralazine IV PP for labile bp with severe range at times  160s/100s, procardia was started with continued labile BP, pt appears generalized edematous, and was also started on  25mg  HCTZ,   The patient was taken to recovery in good condition.  Patient planned to pump feed.  On post-op day 1, patient was doing well, tolerating a regular diet, with Hgb of 11.3.  Throughout her stay, her physical exam was WNL, her incision was CDI, and her vital signs remained stable.  By post-op day 1, she was up ad lib, tolerating a regular diet, with good pain control with po med.  She was deemed to have received the full benefit of her hospital stay, and was discharged home in stable condition.  Contraceptive choice was BTL performed during RCS.  Pt is teary eyes, about baby female in NICU had a pneumothorax 10/4 and chest tube was placed and newborn remains intubated in the NICU. Pt meets all requirements to be discharged today and desires to be discharge to home to visit with daughter. Pt feels stable denies HA, RUQ pain or vision changes and currently BP is 126/88 & 142/90 prior to medication being dispensed this morning. Pt verbalized will pick up from pharmacy 60mg  procardia xl daily and 25mg  HCTZ for 7 days, will also start zoloft 25mg  po daily with H/O PPD, and traumatic birth experience pt will attempt to prophylactically take zoloft to avoid PPD relapse. Pt endorses output being very high and feels less edematous. Pt endorses having high anxiety and can feel her BP rise when mentally distressed but endorses once calm BP normotensive. Will go to [facility] in one week for BP, and mood check. Pt denies si/hi.   Physical exam  Vitals:   02/11/20 1941 02/11/20 2356 02/12/20 0507 02/12/20 0829  BP: (!) 145/96 (!) 152/103 126/88 (!) 142/90  Pulse: 94 94 82 97  Resp: 16 18 18 18   Temp: 98.3 F (36.8 C) 98.3 F (36.8 C) 98.3 F (36.8 C) 98.3 F (36.8 C)  TempSrc: Oral Oral Oral Oral  SpO2: 98% 97% 98% 98%  Weight:      Height:       General: alert, cooperative and no  distress Lochia: appropriate Uterine Fundus: firm Incision: Healing well with no significant drainage, No significant erythema, Dressing is clean, dry, and intact, honeycomb dressing CDI Perineum: Intact DVT Evaluation: No evidence of DVT seen on physical exam. Negative Homan's sign. No cords or calf tenderness. No significant calf/ankle edema.  Labs: Lab Results  Component Value Date   WBC 11.3 (H) 02/10/2020   HGB 11.1 (L) 02/10/2020   HCT 33.5 (L) 02/10/2020   MCV 91.3 02/10/2020   PLT 173 02/10/2020   CMP Latest Ref Rng & Units 02/10/2020  Glucose 70 - 99 mg/dL 98  BUN 6 - 20 mg/dL 12  Creatinine 0.44 -  1.00 mg/dL 0.69  Sodium 135 - 145 mmol/L 135  Potassium 3.5 - 5.1 mmol/L 3.7  Chloride 98 - 111 mmol/L 106  CO2 22 - 32 mmol/L 22  Calcium 8.9 - 10.3 mg/dL 7.3(L)  Total Protein 6.5 - 8.1 g/dL 5.3(L)  Total Bilirubin 0.3 - 1.2 mg/dL 0.2(L)  Alkaline Phos 38 - 126 U/L 52  AST 15 - 41 U/L 28  ALT 0 - 44 U/L 42    Date of discharge: 02/12/2020 Discharge Diagnoses: Preelampsia and with Preterm delivery with RCS at 30.2 weeks Discharge instruction: per After Visit Summary and "Baby and Me Booklet".  After visit meds:   Activity:           unrestricted and pelvic rest Advance as tolerated. Pelvic rest for 6 weeks.  Diet:                routine Medications: PNV, Colace and tylenol, procardia 60mg  xl daily, HCTZ 25mg  daily for 7 days, zoloft 50mg  daily, oxy IR for breakthrough pain.  Postpartum contraception: Tubal Ligation already performed in pt.  Condition:  Pt discharge to home in stable condition, while newborn remains in NICU PreE with SF: Monitor BP in morning if >150/90 report, if <110/70 hold medication, report symptoms of HA, RUQ pain or vision changes. 1 week BP check with [facility].  Anxiety/Depression: Start zoloft 50mg  daily, report SI/HI, 1 week mood check with [facility].   Meds: Allergies as of 02/12/2020      Reactions   Bactrim [sulfamethoxazole-trimethoprim]  Anaphylaxis, Other (See Comments)   "childhood allergy"      Medication List    STOP taking these medications   calcium carbonate 500 MG chewable tablet Commonly known as: TUMS - dosed in mg elemental calcium   ibuprofen 600 MG tablet Commonly known as: ADVIL   oxyCODONE-acetaminophen 5-325 MG tablet Commonly known as: PERCOCET/ROXICET     TAKE these medications   acetaminophen 500 MG tablet Commonly known as: TYLENOL Take 1,000 mg by mouth every 6 (six) hours as needed for mild pain, fever or headache.   hydrochlorothiazide 25 MG tablet Commonly known as: HYDRODIURIL Take 1 tablet (25 mg total) by mouth daily.   NIFEdipine 30 MG 24 hr tablet Commonly known as: PROCARDIA-XL/NIFEDICAL-XL Take 2 tablets (60 mg total) by mouth daily. What changed: medication strength   oxyCODONE 5 MG immediate release tablet Commonly known as: Oxy IR/ROXICODONE Take 1 tablet (5 mg total) by mouth every 4 (four) hours as needed for moderate pain.   pantoprazole 40 MG tablet Commonly known as: PROTONIX Take 40 mg by mouth daily.   PRENATAL PO Take 1 tablet by mouth daily.   sertraline 50 MG tablet Commonly known as: Zoloft Take 1 tablet (50 mg total) by mouth daily. In one week if no side effect may increase to 75 mg daily.            Discharge Care Instructions  (From admission, onward)         Start     Ordered   02/12/20 0000  Discharge wound care:       Comments: Take dressing off on day 5-7 postpartum.  Report increased drainage, redness or warmth. Clean with water, let soap trickle down body. Can leave steri strips on until they fall off or take them off gently at day 10. Keep open to air, clean and dry.   02/12/20 0747          Discharge Follow Up:   Follow-up Information  [facility]. Schedule an appointment as soon as possible for a visit in 1 week(s).   Specialty: Obstetrics and Gynecology Why: 1 week BP check with mood and  insicion check and 6 weeks PPV Contact information: [address]. [address] [place] [address] [place], NP-C, CNM 02/12/2020, 1:07 PM  Noralyn Pick, [place]

## 2020-02-20 ENCOUNTER — Ambulatory Visit: Payer: Self-pay

## 2020-02-20 NOTE — Lactation Note (Signed)
This note was copied from a baby's chart. Lactation Consultation Note  Patient Name: Boy Allysha Tryon TXHFS'F Date: 02/20/2020 Reason for consult: Follow-up assessment;Preterm <34wks;Infant < 6lbs;NICU baby  P2 mother whose infant is now 47 days old.  This is a preterm baby born at 30+2 weeks with a CGA of 31+6 weeks, weighing < 3 lbs and in the NICU.  RN requested a lactation visit.  Mother had some concerns to discuss.  Mother was pumping with her new [employer]ow pump when I arrived.  She had been obtaining approximately 30-60 mls/pumping session within the first week after delivery.  Mother stated that her milk supply has now decreased to approximately half that amount.  This decrease has been going on for the last few days.  It is important to note that her son has developed a left sided pneumothorax per NP note and is currently on the jet ventilator.  Discussed the high level of stress that has incurred with this development.  Baby is currently NPO and parents are anxious for him to resume feedings again.  Discussed how stress can have a detrimental effect on milk production.  Reviewed breast massage, hand expression, relaxation, adequate nutrition, sleep and hydration with mother.  Mother acknowledged that she has been stressed and last night was the first night that both parents slept at home.  Mother admitted to getting a full night's sleep due to her exhaustion.  She feels good this morning after obtaining plenty of rest.  Suggested mother incorporate power pumping into her routine, either once or twice per day.  Suggested first thing in the morning and provided guidelines for power pumping.  Mother made notes in her phone for a reminder.  She may also do this once in the evening.  Her supply is very low during the night hours.  Encouraged mother to continue to practice relaxation and dark, quiet peaceful environment when pumping; observing her baby during pumping sessions in the  hospital.  Mother will continue with this plan.  Lactation follow up scheduled for this Saturday after noon to evaluate the plan's success.  Mother informed that [facility] services will be provided for as long as she desires assistance.  Father supportive.   Maternal Data    Feeding    LATCH Score                   Interventions    Lactation Tools Discussed/Used     Consult Status Consult Status: Follow-up Date: 02/23/20 Follow-up type: In-patient    Dia Donate R Cotey Rakes 02/20/2020, 10:15 AM

## 2020-02-25 ENCOUNTER — Ambulatory Visit: Payer: Self-pay

## 2020-02-25 NOTE — Lactation Note (Signed)
This note was copied from a baby's chart. Lactation Consultation Note  Patient Name: Sarah Berry TUYWS'B Date: 02/25/2020 Reason for consult: Follow-up assessment;NICU baby;Preterm <34wks;Nipple pain/trauma Mother concerned about low milk supply. Reviewed pumping strategies: hands-on pumping, breast massage. Mom with bilateral nipple redness. Increased to 36mm flange and recommended coconut oil. Will plan f/u visit.  Interventions Interventions: Breast feeding basics reviewed;Breast massage;Breast compression;DEBP  Lactation Tools Discussed/Used Tools: Flanges Flange Size: 30   Consult Status Consult Status: Follow-up Date: 02/26/20 Follow-up type: In-patient    Gwynne Edinger 02/25/2020, 11:27 AM

## 2020-02-27 ENCOUNTER — Ambulatory Visit: Payer: Self-pay

## 2020-02-27 NOTE — Lactation Note (Signed)
This note was copied from a baby's chart. Lactation Consultation Note  Patient Name: Sarah Berry [id number]'U Date: 02/27/2020 Reason for consult: Mother's request;Follow-up assessment;NICU baby;Preterm <34wks;Other (Comment) (NICU RN called - Margot Ables mom has questions about pumping)  Baby is 27 weeks old, P 2  Per mom has been feeling sore pumping 20 -30 mins 6 times a day with #30 Flanges. Volumes are the most in the am 60 ml.  LC recommended when her breast are the fullest use the #30 Flanges and when not as full the #27 F.  Increase number of pumpings per day for 15 -20 mins and decrease down from 30 mins. Prior to pumping apply moist warm heat to breast to help let down and then pump and a dab of coconut oil with pumping.  Mom mentioned she is not able to get much with hand expressing.  Another option - last pumping of the day in the evening before bed - power pump either 20 mins on 10 mins off over 60 mins or 10 mins on off over 60 mins. Sleep 5 hours and then 1st pumping after a good sleep power pump over 60 mins. Mom did mentioned her volume is greater after sleep.  Mom mentioned she used her Medela ( comfort gels last night and they felt good). Mom denies any skin breakdown of the nipples.   LC encouraged to mom to have the NICU RN call LC if needed.  [place] praised mom for her pumping efforts.     Maternal Data    Feeding Feeding Type: Breast Milk  LATCH Score                   Interventions Interventions: Breast feeding basics reviewed;DEBP  Lactation Tools Discussed/Used Tools: Pump Flange Size: 30 (see LC note for flange recommendations) Breast pump type: Double-Electric Breast Pump   Consult Status Consult Status: Follow-up Date: 02/28/20 Follow-up type: In-patient    [place] 02/27/2020, 1:41 PM

## 2020-02-29 ENCOUNTER — Ambulatory Visit: Payer: Self-pay

## 2020-02-29 NOTE — Lactation Note (Signed)
This note was copied from a baby's chart. Lactation Consultation Note  Patient Name: Sarah Berry Date: 02/29/2020 Reason for consult: Follow-up assessment;NICU baby;Preterm <34wks LC to room for f/u visit. Mother continues to pump and is pleased that her supply has increased. Will plan f/u visit prn.  Feeding Feeding Type: Breast Milk  LATCH Score                   Interventions Interventions: Breast feeding basics reviewed;DEBP  Lactation Tools Discussed/Used     Consult Status Consult Status: Follow-up Date: 03/07/20 Follow-up type: In-patient    Gwynne Edinger 02/29/2020, 4:50 PM

## 2020-03-07 ENCOUNTER — Ambulatory Visit: Payer: Self-pay

## 2020-03-07 NOTE — Lactation Note (Signed)
This note was copied from a baby's chart. Lactation Consultation Note  Patient Name: Boy Tangi Shroff [id number]'[id number] Date: 03/07/2020 Reason for consult: Follow-up assessment   Very brief visit. Parents resting. Mother continues to pump and supply milk to meet current infant intake. She denies need for additional pumping supplies at this time. No questions/concerns at this time. Will plan to f/u prn. No charge.   Consult Status Consult Status: Follow-up Date: 03/08/20 Follow-up type: In-patient    Gwynne Edinger 03/07/2020, 2:07 PM

## 2020-03-20 ENCOUNTER — Ambulatory Visit: Payer: Self-pay

## 2020-03-20 NOTE — Lactation Note (Signed)
This note was copied from a baby's chart. Lactation Consultation Note  Patient Name: Sarah Berry [id number] Date: 03/20/2020 Reason for consult: Follow-up assessment;NICU baby  LC to room for f/u visit. Mom continues to pump 5-6 times per day. She is yielding enough to meet infant's current needs but is concerned because his intake is increasing. We discussed strategies to increase milk production. Per mom, she believes that the 72 hour protected window is likely to begin soon. LC discussed that lactation assistance is available during that time.   Consult Status Consult Status: Follow-up Date: 03/21/20 Follow-up type: In-patient    Gwynne Edinger 03/20/2020, 12:51 PM

## 2020-03-29 ENCOUNTER — Ambulatory Visit: Payer: Self-pay

## 2020-03-29 NOTE — Lactation Note (Signed)
This note was copied from a baby's chart. Lactation Consultation Note  Patient Name: Boy Johnathon Mittal ZOXWR'U Date: 03/29/2020 Reason for consult: Follow-up assessment;NICU baby  LC to room for f/u visit. Mother holding sleeping baby. She continues to pump with yield that meets infant's needs. Mom with questions about her diet. Specifically, she asks if she can eat tuna. Reviewed fish with high levels of mercury and provided handout  "Advice about eating fish" FDA, 2021. Will plan f/u visit. Mother is aware of lactation services.   Consult Status Consult Status: Follow-up Date: 03/30/20 Follow-up type: In-patient    Gwynne Edinger 03/29/2020, 1:43 PM

## 2020-04-01 ENCOUNTER — Ambulatory Visit: Payer: Self-pay

## 2020-04-01 NOTE — Lactation Note (Signed)
This note was copied from a baby's chart. Lactation Consultation Note  Patient Name: Sarah Berry OERQS'X Date: 04/01/2020 Reason for consult: Follow-up assessment;NICU baby  LC to room for follow up visit.  Mom pumping during visit with 65mL yield. We reviewed pumping strategies. Patient was provided with the opportunity to ask questions. All concerns were addressed.  Will plan follow up visit.     Consult Status Consult Status: Follow-up Date: 04/02/20 Follow-up type: In-patient    Gwynne Edinger 04/01/2020, 5:34 PM

## 2020-04-08 ENCOUNTER — Encounter (HOSPITAL_COMMUNITY): Payer: Self-pay | Admitting: Obstetrics & Gynecology

## 2020-04-09 ENCOUNTER — Ambulatory Visit: Payer: Self-pay

## 2020-04-09 NOTE — Lactation Note (Signed)
This note was copied from a baby's chart. Lactation Consultation Note  Patient Name: Sarah Berry SLHTD'S Date: 04/09/2020 Reason for consult: Follow-up assessment;NICU baby  LC to room at RN request to assist with bf. Baby licked and nuzzled but did not bf during this visit. This LC pointed out pre-feeding signs and progression to breastfeeding. Encouraged mom to continue to "practice" until baby is able to latch and bf effectively. Mom has a good supply of milk and continues to pump 8xday. LC will plan f/u care according to the needs of mother and treatment team.   Feeding Feeding Type: Breast Milk  LATCH Score Latch: Too sleepy or reluctant, no latch achieved, no sucking elicited.  Audible Swallowing: None  Type of Nipple: Everted at rest and after stimulation  Comfort (Breast/Nipple): Soft / non-tender  Hold (Positioning): Assistance needed to correctly position infant at breast and maintain latch.  LATCH Score: 5  Interventions Interventions: Breast feeding basics reviewed;Support pillows;Position options;Assisted with latch;Skin to skin;Expressed milk;Hand express;Adjust position   Consult Status Consult Status: Follow-up Date: 04/10/20 Follow-up type: In-patient    Gwynne Edinger 04/09/2020, 3:34 PM

## 2020-04-11 ENCOUNTER — Ambulatory Visit: Payer: Self-pay

## 2020-04-11 NOTE — Lactation Note (Addendum)
This note was copied from a baby's chart. Lactation Consultation Note  Patient Name: Sarah Berry [id number] Date: 04/11/2020 Reason for consult: Follow-up assessment;NICU baby  LC to infant's room for f/u visit with mother. Mother pleased that baby bf for 7 minutes with swallows yesterday. Baby on increased oxygen today and receiving vaccines so mother providing comfort care/holding today. She will resume bf when baby is well and demonstrating cues. LC celebrated yesterday's bf success with mom. Will plan f/u visit.    Consult Status Consult Status: Follow-up Date: 04/12/20 Follow-up type: In-patient    Gwynne Edinger 04/11/2020, 11:31 AM

## 2020-04-15 ENCOUNTER — Ambulatory Visit: Payer: Self-pay

## 2020-04-15 NOTE — Lactation Note (Signed)
This note was copied from a baby's chart.   Patient Name: Sarah Berry [id number] Date: 04/15/2020 Reason for consult: Follow-up assessment (initiation of 72-hour protected bf window (04/16/20))  Infant Data and Feeding Feeding Type: Breast Milk GA 30+2 CGA today 39+5 Feeding method: gavage, breast Feeding type: breast milk  Maternal Data  g2p2 C-Section Hx Pre-E Symphony pump for at-home use Pumping 8xday with yield sufficient for infant's needs  Lactation Consultation Note LC to room for f/u visit. FOB present. Mother at home preparing for 72-hour protected bf window that will begin in the morning. LC will plan f/u visit to assist prn.   Consult Status Consult Status: Follow-up Date: 04/16/20 Follow-up type: [place], MA IBCLC 04/15/2020, 5:17 PM

## 2020-04-16 ENCOUNTER — Ambulatory Visit: Payer: Self-pay

## 2020-04-16 NOTE — Lactation Note (Signed)
This note was copied from a baby's chart. Lactation Consultation Note  Patient Name: Sarah Berry KGOVP'C Date: 04/16/2020 Reason for consult: Follow-up assessment;NICU baby   74 month old infant CGA [redacted]w[redacted]d.  Mother pumping upon entering.  Pumped 60 ml.  Volumes have been 60-90 ml. Infant in 36 hour protected window. 0900 infant latched for 2 min, 1200 latch for 15 min and 1500 no latch per RN.  Lactation to follow up during a feeding tomorrow.  Feeding times 0900,1200, 1500, 1800.  Encouraged STS prior to feeding.      Maternal Data    Feeding Feeding Type: Breast Milk  LATCH Score                   Interventions Interventions: DEBP  Lactation Tools Discussed/Used     Consult Status Consult Status: Follow-up Date: 04/17/20 Follow-up type: In-patient    Sarah Berry [facility] 04/16/2020, 4:05 PM

## 2020-04-17 ENCOUNTER — Ambulatory Visit: Payer: Self-pay

## 2020-04-17 NOTE — Lactation Note (Signed)
This note was copied from a baby's chart. Patient Name: Sarah Berry Date: 04/17/2020 Reason for consult: Follow-up assessment  Maternal Data  G2P2 C-Section Hx Pre-E Pumping 8xday with sufficient volume   Infant Data and Feeding 72-hour protected bf window Has had introduction to bottle Supplemental oxygen discontinued yesterday Bf attempts with varying success over the past 2 days  Lactation Consultation Note LC to room at RN request to assist with bf attempt. Observe increased work of breathe with feeding attempt. Two audible swallows but no sustained latch with feed. Nipple shield provided to ease latch. Assured mother that her bf technique is fine and that increased work of breathe is likely the reason Aline Brochure did not maintain latch during this attempt. Offered to return to assist further prn.   Consult Status Consult Status: Follow-up Date: 04/18/20 Follow-up type: [place], MA IBCLC 04/17/2020, 3:48 PM

## 2020-04-20 ENCOUNTER — Ambulatory Visit: Payer: Self-pay

## 2020-04-20 NOTE — Lactation Note (Signed)
This note was copied from a baby's chart. Lactation Consultation Note  Patient Name: Sarah Berry OKHTX'H Date: 04/20/2020 Reason for consult: Follow-up assessment;NICU baby   LC called RN to check on mom.  RN requested LC come to room; mom has just attempted to breastfeed but was bottle feeding due to infant not latching.  Mom has infant in her lap bottle feeding him.  She states infant was doing well at the breast, but now seems to be irritable after just a brief period at the breast.  LC inquired about pumping and mom explains how it is very painful and sore at the end of the day and the only way to collect milk when pumping is to turn the suction up all the way.  Mom is pumping 6-8X in 24 hours and getting in between 60-113ml with each pumping session.  Mom feels the flange size is correct and that she has had LC's check fit.  LC inquired about latching infant with a NS and mom feels the NS doesn't work, comes off, and is not sustainable for future breastfeeding.    LC offered to come to infant's next feeding at 5.  Mom feels this would be helpful and would like a consult with the next feeding.    [place] praised mom for pumping efforts and acknowledged how difficult this is for her right now.     Maternal Data    Feeding Feeding Type: Breast Milk Nipple Type: Nfant Extra Slow Flow (gold)  LATCH Score Latch: Grasps breast easily, tongue down, lips flanged, rhythmical sucking.  Audible Swallowing: Spontaneous and intermittent  Type of Nipple: Everted at rest and after stimulation  Comfort (Breast/Nipple): Filling, red/small blisters or bruises, mild/mod discomfort (mild to moderate discomfort)  Hold (Positioning): No assistance needed to correctly position infant at breast.  LATCH Score: 9  Interventions Interventions: Skin to skin;Breast massage;Hand express  Lactation Tools Discussed/Used     Consult Status Consult Status: Follow-up Date: 04/20/20 Follow-up type:  In-patient    Ferne Coe [facility] 04/20/2020, 2:24 AM

## 2020-04-20 NOTE — Lactation Note (Signed)
This note was copied from a baby's chart. Lactation Consultation Note  Patient Name: Sarah Berry [id number]'R Date: 04/20/2020 Reason for consult: Mother's request;NICU baby;Difficult latch  Mom requested LC assistance with latching.  Infant cueing and mom attempted to latch in cross cradle on left.  She feels the left side lets down better for infant.  Infant became very fussy attempting to latch so LC suggested laid back position but infant would not latch. NS prefilled with mom's milk used for latch attempt but infant became more fussy.  Mom did STS then [facility] suggested mom try a football position.  Mom had previously said that infant did not like the position but did mention she felt with the breast and a bottle, the nipple had to be pointed directly at the roof of Harrison's mouth and "just right" in order for him to suck.  Infant sucked gloved finger and LC noted a very high, narrowly arched palate.  Infant latched easily to football on right side.  He fed actively sucking and swallowing for 8 minutes before falling asleep.  Nipple was mostly round with a slight crimping at the top of the nipple.  Infant was then moved to the left side and mom was able to latch infant without any assistance.  Infant fed well with audible swallows for 8 more minutes before falling asleep.  The nipple was completed round without pinching when infant came off the breasts.  Infant was held STS while sleeping. His body was   LC praised mom for continuing to breastfeed and pump to provide breastmilk for her infant.   Maternal Data    Feeding Feeding Type: Breast Fed  LATCH Score Latch: Grasps breast easily, tongue down, lips flanged, rhythmical sucking.  Audible Swallowing: Spontaneous and intermittent  Type of Nipple: Everted at rest and after stimulation  Comfort (Breast/Nipple): Filling, red/small blisters or bruises, mild/mod discomfort (sensitivity to anything touching the nipple in general; all the  time)  Hold (Positioning): Assistance needed to correctly position infant at breast and maintain latch.  LATCH Score: 8  Interventions Interventions: Skin to skin;Hand express;Position options;Support pillows;Adjust position  Lactation Tools Discussed/Used     Consult Status Consult Status: PRN Follow-up type: In-patient    Ferne Coe [facility] 04/20/2020, 6:40 AM

## 2020-04-20 NOTE — Lactation Note (Signed)
This note was copied from a baby's chart. Lactation Consultation Note  Patient Name: Sarah Berry Date: 04/20/2020 Reason for consult: Follow-up assessment;Mother's request;NICU baby  LC to to room for observed bf. Baby on with rhythmic suck / swallows. Fed approximately 15 minutes on each breast and slept soundly on mom's chest p bf. Mom with signs/symptoms consistent with yeast on / in L breast. Referred to MD for diagnosis and treatment. Mother was provided with the opportunity to ask questions. All concerns were addressed. Will plan follow up visit.   Consult Status Consult Status: Follow-up Follow-up type: In-patient  Gwynne Edinger, MA IBCLC 04/20/2020, 12:40 PM

## 2020-04-24 ENCOUNTER — Ambulatory Visit: Payer: Self-pay

## 2020-04-24 NOTE — Lactation Note (Signed)
This note was copied from a baby's chart. Lactation Consultation Note  Patient Name: Sarah Berry ACZYS'A Date: 04/24/2020 Reason for consult: Follow-up assessment  LC in to assist with positioning and latching baby to breast.  Baby is 2 months old and AGA 41 wks.    Mom has been consistently pumping for volumes of 60-120 ml per session.  Baby recently finished a 43 hr protected breastfeeding window and lost weight.  Mom has been discouraged, but continues to pump and provide her breast milk.  Baby is now on 30 cal Similac Special Care 1:1 due to weight loss/slow weight gain.   Mom continues to put baby to breast for 5-7 mins before he falls asleep.  Gavage tube out and baby getting supplement by bottle.   AC/PC weight done after baby breastfed 28 mins.  Baby gained 25 ml total from feeding on both breasts.  Encouraged Mom to pump after baby breastfeeds.   Baby fed with assistance in cross cradle hold, Mom needing guidance on supporting her breast in a U hold and firmly supporting baby's head. Rolled towel placed under baby's head once he was deeply latched.  Mom taught to use alternate breast compression to increase milk transfer.  Regular swallows identified.  When baby came off the breast, some pinching noted on edge of nipples.  Oral assessment revealed a short posterior lingual frenulum, slight labial frenulum to gum ridge, and a highly arched palate. Mom is not wanting to try a nipple shield which LC recommended.  This may be contributing to decreased milk transfer.    Recommended OP lactation follow-up to assess baby's oral structures and transferring of milk at the breast.    Baby offered bottle supplement as baby started cueing again after being latched 3 times.  Mom able to latch baby independently in football hold.    Feeding Feeding Type: Breast Fed Nipple Type: Dr. Myra Gianotti Preemie  LATCH Score Latch: Grasps breast easily, tongue down, lips flanged, rhythmical  sucking.  Audible Swallowing: Spontaneous and intermittent  Type of Nipple: Everted at rest and after stimulation  Comfort (Breast/Nipple): Soft / non-tender  Hold (Positioning): Assistance needed to correctly position infant at breast and maintain latch.  LATCH Score: 9  Interventions Interventions: Breast feeding basics reviewed;Assisted with latch;Skin to skin;Breast massage;Hand express;Breast compression;Adjust position;Support pillows;Position options;Expressed milk;DEBP  Lactation Tools Discussed/Used Tools: Pump Breast pump type: Double-Electric Breast Pump   Consult Status Consult Status: Follow-up Date: 04/25/20 Follow-up type: In-patient    Broadus John 04/24/2020, 2:56 PM

## 2020-04-25 ENCOUNTER — Ambulatory Visit: Payer: Self-pay

## 2020-04-25 NOTE — Lactation Note (Signed)
This note was copied from a baby's chart. Lactation Consultation Note  Patient Name: Sarah Berry HDTPN'S Date: 04/25/2020   Lactation follow-up requested.  Baby being discharged and Mom aware of OP LC support available.  Encouraged Mom to call prn for concerns.   Broadus John 04/25/2020, 11:51 AM

## 2021-01-19 ENCOUNTER — Ambulatory Visit: Payer: 59 | Admitting: Podiatry

## 2022-02-16 ENCOUNTER — Other Ambulatory Visit (HOSPITAL_COMMUNITY): Payer: Self-pay

## 2022-02-16 MED ORDER — WEGOVY 0.25 MG/0.5ML ~~LOC~~ SOAJ
0.2500 mg | SUBCUTANEOUS | 1 refills | Status: AC
  Filled 2022-02-16 – 2022-02-17 (×3): qty 2, 28d supply, fill #0

## 2022-02-17 ENCOUNTER — Other Ambulatory Visit (HOSPITAL_COMMUNITY): Payer: Self-pay

## 2022-02-18 ENCOUNTER — Other Ambulatory Visit (HOSPITAL_BASED_OUTPATIENT_CLINIC_OR_DEPARTMENT_OTHER): Payer: Self-pay

## 2022-02-18 MED ORDER — INFLUENZA VAC SPLIT QUAD 0.5 ML IM SUSY
PREFILLED_SYRINGE | INTRAMUSCULAR | 0 refills | Status: AC
  Filled 2022-02-18: qty 0.5, 1d supply, fill #0

## 2022-02-18 MED ORDER — COVID-19 MRNA 2023-2024 VACCINE (COMIRNATY) 0.3 ML INJECTION
INTRAMUSCULAR | 0 refills | Status: AC
  Filled 2022-02-18: qty 0.3, 1d supply, fill #0

## 2022-02-22 ENCOUNTER — Other Ambulatory Visit (HOSPITAL_BASED_OUTPATIENT_CLINIC_OR_DEPARTMENT_OTHER): Payer: Self-pay

## 2022-03-12 ENCOUNTER — Ambulatory Visit (INDEPENDENT_AMBULATORY_CARE_PROVIDER_SITE_OTHER): Payer: 59 | Admitting: Plastic Surgery

## 2022-03-12 ENCOUNTER — Encounter: Payer: Self-pay | Admitting: Plastic Surgery

## 2022-03-12 VITALS — BP 137/97 | HR 80 | Ht 67.5 in | Wt 188.0 lb

## 2022-03-12 DIAGNOSIS — M546 Pain in thoracic spine: Secondary | ICD-10-CM

## 2022-03-12 DIAGNOSIS — N62 Hypertrophy of breast: Secondary | ICD-10-CM | POA: Diagnosis not present

## 2022-03-12 DIAGNOSIS — M542 Cervicalgia: Secondary | ICD-10-CM

## 2022-03-12 DIAGNOSIS — R21 Rash and other nonspecific skin eruption: Secondary | ICD-10-CM

## 2022-03-12 NOTE — Progress Notes (Signed)
Referring Provider No referring provider defined for this encounter.   CC:  Chief Complaint  Patient presents with   Consult           Sarah Berry is an 36 y.o. female.  HPI: This is a 36 year old female who presents for discussion of bilateral breast reduction.  She notes upper back and neck pain as well as rashes under her breasts.  She has difficulty performing daily activities and exercising due to the large size of her breast.  She treats her rashes with topical over-the-counter ointments.  Allergies  Allergen Reactions   Bactrim [Sulfamethoxazole-Trimethoprim] Anaphylaxis and Other (See Comments)    "childhood allergy"    Outpatient Encounter Medications as of 03/12/2022  Medication Sig   acetaminophen (TYLENOL) 500 MG tablet Take 1,000 mg by mouth every 6 (six) hours as needed for mild pain, fever or headache.   COVID-19 mRNA vaccine 2023-2024 (COMIRNATY) SUSP injection Inject into the muscle.   influenza vac split quadrivalent PF (FLUARIX) 0.5 ML injection Inject into the muscle.   losartan (COZAAR) 50 MG tablet Take 50 mg by mouth daily.   Semaglutide-Weight Management (WEGOVY) 0.25 MG/0.5ML SOAJ Inject 0.25 mg into the skin once a week.   sertraline (ZOLOFT) 50 MG tablet Take 1 tablet (50 mg total) by mouth daily. In one week if no side effect may increase to 75 mg daily.   NIFEdipine (PROCARDIA-XL/NIFEDICAL-XL) 30 MG 24 hr tablet Take 2 tablets (60 mg total) by mouth daily.   [DISCONTINUED] hydrochlorothiazide (HYDRODIURIL) 25 MG tablet Take 1 tablet (25 mg total) by mouth daily.   [DISCONTINUED] oxyCODONE (OXY IR/ROXICODONE) 5 MG immediate release tablet Take 1 tablet (5 mg total) by mouth every 4 (four) hours as needed for moderate pain.   [DISCONTINUED] pantoprazole (PROTONIX) 40 MG tablet Take 40 mg by mouth daily.   [DISCONTINUED] Prenatal Vit-Fe Fumarate-FA (PRENATAL PO) Take 1 tablet by mouth daily.   No facility-administered encounter medications on file as  of 03/12/2022.     Past Medical History:  Diagnosis Date   Fibroid    History of postpartum hypertension 2018   Infertility, female, primary    Newborn product of in vitro fertilization (IVF) pregnancy    Placenta previa     Past Surgical History:  Procedure Laterality Date   CESAREAN SECTION N/A 05/19/2016   Procedure: CESAREAN SECTION;  Surgeon: Sanjuana Kava, MD;  Location: [place];  Service: Obstetrics;  Laterality: N/A;   CESAREAN SECTION N/A 02/09/2020   Procedure: CESAREAN SECTION;  Surgeon: Sanjuana Kava, MD;  Location: [facility];  Service: Obstetrics;  Laterality: N/A;   egg retrieval     MYOMECTOMY     TUBAL LIGATION Bilateral 02/09/2020   Procedure: BILATERAL TUBAL LIGATION;  Surgeon: Sanjuana Kava, MD;  Location: [place] [facility];  Service: Obstetrics;  Laterality: Bilateral;   WISDOM TOOTH EXTRACTION      Family History  Problem Relation Age of Onset   Cancer Paternal Grandmother    Hypertension Mother    Hypertension Father     Social History   Social History Narrative   Not on file     Review of Systems General: Denies fevers, chills, weight loss CV: Denies chest pain, shortness of breath, palpitations Breast: Large breasts that cause pain and discomfort in the upper back and neck as well as grooving at the shoulders from her bra straps.  Physical Exam    03/12/2022   11:17 AM 02/12/2020    8:29 AM 02/12/2020  5:07 AM  Vitals with BMI  Height 5' 7.5"    Weight 188 lbs    BMI 82.50    Systolic 037 048 889  Diastolic 97 90 88  Pulse 80 97 82    General:  No acute distress,  Alert and oriented, Non-Toxic, Normal speech and affect Breast: Bilateral large breasts somewhat pendulous with grade 2 ptosis.  No nipple discharge or nipple abnormalities.  The right breast is obviously larger than the left breast.  On her photos she has asymmetry of the shoulders with the right shoulder being lower than the left presumably due to the weight of her right  breast. Mammogram: No mammograms at this point due to age Assessment/Plan Symptomatic macromastia: Patient has very large breast bilaterally and is requesting bilateral breast reduction.  I feel that this is a reasonable request given the size of her breast in relationship to her body habitus.  I believe I can remove 500 g per breast with a reduction.  We discussed the procedure at length including the location of the incisions and the unpredictable nature of scarring.  We discussed the risks of bleeding, infection, and seroma formation.  We discussed the specific risks with a breast reduction of loss of the nipple due to ischemia and changes in feeling in the skin of the breast and the nipples.  We also discussed at great length the fact that she cannot be guaranteed a cup size with this procedure.  The breast reduction is designed to remove a sufficient amount of tissue to relieve her pain while leaving her with a aesthetically acceptable breast.  She verbalized understanding.  We also discussed the postoperative restrictions including no heavy lifting greater than 20 pounds no vigorous activity and no submerging the incisions in water for 6 weeks.  She will be allowed to return to light activities as she desires.  Photographs were taken with her consent.  Submit for consideration.  Camillia Herter 03/12/2022, 5:12 PM

## 2022-03-26 ENCOUNTER — Telehealth: Payer: Self-pay | Admitting: *Deleted

## 2022-03-26 NOTE — Telephone Encounter (Signed)
Authorization request has been submitted Ins: [employer] CPT - Units: 00712 - 2  Pend Auth: [id number]  Patient notified via Mychart of submission

## 2022-05-30 ENCOUNTER — Encounter: Payer: Self-pay | Admitting: Plastic Surgery

## 2022-05-31 ENCOUNTER — Telehealth: Payer: Self-pay | Admitting: *Deleted

## 2022-05-31 NOTE — Telephone Encounter (Signed)
Spoke to somoen at [place] [facility] [place] today who stated case was denied on 11/28 but the denial was sent to the hospital instead of [place]. I do not know what fax number they sent it too but it started [phone] which is also not [place]. Requested the now send the letter to [place].   Sent MyChart message to patient about denial and options to appeal such as completing Physical therapy for neck and upper back pain and to consult with Dr. Darene Lamer on if its possible to have 628g of tissue removed from each breast per her plans requirement for her BSA.

## 2022-06-10 ENCOUNTER — Encounter: Payer: Self-pay | Admitting: *Deleted

## 2022-06-15 ENCOUNTER — Other Ambulatory Visit: Payer: Self-pay | Admitting: Obstetrics and Gynecology

## 2022-06-15 DIAGNOSIS — R928 Other abnormal and inconclusive findings on diagnostic imaging of breast: Secondary | ICD-10-CM

## 2022-06-24 ENCOUNTER — Ambulatory Visit
Admission: RE | Admit: 2022-06-24 | Discharge: 2022-06-24 | Disposition: A | Discharge status: Home / Self Care | Payer: Managed Care, Other (non HMO) | Source: Ambulatory Visit | Attending: Obstetrics and Gynecology | Admitting: Obstetrics and Gynecology

## 2022-06-24 ENCOUNTER — Ambulatory Visit
Admission: RE | Admit: 2022-06-24 | Discharge: 2022-06-24 | Disposition: A | Discharge status: Home / Self Care | Payer: 59 | Source: Ambulatory Visit | Attending: Obstetrics and Gynecology | Admitting: Obstetrics and Gynecology

## 2022-06-24 DIAGNOSIS — R928 Other abnormal and inconclusive findings on diagnostic imaging of breast: Secondary | ICD-10-CM

## 2022-10-30 ENCOUNTER — Emergency Department (HOSPITAL_BASED_OUTPATIENT_CLINIC_OR_DEPARTMENT_OTHER): Payer: Managed Care, Other (non HMO)

## 2022-10-30 ENCOUNTER — Encounter (HOSPITAL_BASED_OUTPATIENT_CLINIC_OR_DEPARTMENT_OTHER): Payer: Self-pay

## 2022-10-30 ENCOUNTER — Emergency Department (HOSPITAL_BASED_OUTPATIENT_CLINIC_OR_DEPARTMENT_OTHER)
Admission: EM | Admit: 2022-10-30 | Discharge: 2022-10-30 | Disposition: A | Discharge status: Home / Self Care | Payer: Managed Care, Other (non HMO) | Attending: Emergency Medicine | Admitting: Emergency Medicine

## 2022-10-30 DIAGNOSIS — I10 Essential (primary) hypertension: Secondary | ICD-10-CM | POA: Diagnosis not present

## 2022-10-30 DIAGNOSIS — R21 Rash and other nonspecific skin eruption: Secondary | ICD-10-CM

## 2022-10-30 DIAGNOSIS — M2550 Pain in unspecified joint: Secondary | ICD-10-CM | POA: Diagnosis present

## 2022-10-30 DIAGNOSIS — Z79899 Other long term (current) drug therapy: Secondary | ICD-10-CM | POA: Insufficient documentation

## 2022-10-30 DIAGNOSIS — R109 Unspecified abdominal pain: Secondary | ICD-10-CM | POA: Insufficient documentation

## 2022-10-30 DIAGNOSIS — D649 Anemia, unspecified: Secondary | ICD-10-CM | POA: Diagnosis not present

## 2022-10-30 DIAGNOSIS — R519 Headache, unspecified: Secondary | ICD-10-CM | POA: Diagnosis not present

## 2022-10-30 LAB — COMPREHENSIVE METABOLIC PANEL
ALT: 19 U/L (ref 0–44)
AST: 18 U/L (ref 15–41)
Albumin: 4.3 g/dL (ref 3.5–5.0)
Alkaline Phosphatase: 37 U/L — ABNORMAL LOW (ref 38–126)
Anion gap: 11 (ref 5–15)
BUN: 11 mg/dL (ref 6–20)
CO2: 22 mmol/L (ref 22–32)
Calcium: 8.8 mg/dL — ABNORMAL LOW (ref 8.9–10.3)
Chloride: 106 mmol/L (ref 98–111)
Creatinine, Ser: 0.73 mg/dL (ref 0.44–1.00)
GFR, Estimated: >60 mL/min (ref >60)
Glucose, Bld: 114 mg/dL — ABNORMAL HIGH (ref 70–99)
Potassium: 3.5 mmol/L (ref 3.5–5.1)
Sodium: 139 mmol/L (ref 135–145)
Total Bilirubin: 0.5 mg/dL (ref 0.3–1.2)
Total Protein: 7.1 g/dL (ref 6.5–8.1)

## 2022-10-30 LAB — URINALYSIS, ROUTINE W REFLEX MICROSCOPIC
Bilirubin Urine: NEGATIVE
Glucose, UA: NEGATIVE mg/dL
Ketones, ur: 15 mg/dL — AB
Leukocytes,Ua: NEGATIVE
Nitrite: NEGATIVE
Specific Gravity, Urine: 1.015 (ref 1.005–1.030)
pH: 6 (ref 5.0–8.0)

## 2022-10-30 LAB — CBC WITH DIFFERENTIAL/PLATELET
Abs Immature Granulocytes: 0.03 10*3/uL (ref 0.00–0.07)
Basophils Absolute: 0 10*3/uL (ref 0.0–0.1)
Basophils Relative: 1 %
Eosinophils Absolute: 0.3 10*3/uL (ref 0.0–0.5)
Eosinophils Relative: 6 %
HCT: 34.5 % — ABNORMAL LOW (ref 36.0–46.0)
Hemoglobin: 11.3 g/dL — ABNORMAL LOW (ref 12.0–15.0)
Immature Granulocytes: 1 %
Lymphocytes Relative: 22 %
Lymphs Abs: 1.2 10*3/uL (ref 0.7–4.0)
MCH: 25.4 pg — ABNORMAL LOW (ref 26.0–34.0)
MCHC: 32.8 g/dL (ref 30.0–36.0)
MCV: 77.5 fL — ABNORMAL LOW (ref 80.0–100.0)
Monocytes Absolute: 0.2 10*3/uL (ref 0.1–1.0)
Monocytes Relative: 4 %
Neutro Abs: 3.5 10*3/uL (ref 1.7–7.7)
Neutrophils Relative %: 66 %
Platelets: 288 10*3/uL (ref 150–400)
RBC: 4.45 MIL/uL (ref 3.87–5.11)
RDW: 14.7 % (ref 11.5–15.5)
WBC: 5.3 10*3/uL (ref 4.0–10.5)
nRBC: 0 % (ref 0.0–0.2)

## 2022-10-30 LAB — LACTIC ACID, PLASMA: Lactic Acid, Venous: 0.9 mmol/L (ref 0.5–1.9)

## 2022-10-30 LAB — C-REACTIVE PROTEIN: CRP: 0.6 mg/dL (ref <1.0)

## 2022-10-30 LAB — SEDIMENTATION RATE: Sed Rate: 25 mm/hr — ABNORMAL HIGH (ref 0–22)

## 2022-10-30 LAB — PREGNANCY, URINE: Preg Test, Ur: NEGATIVE

## 2022-10-30 LAB — D-DIMER, QUANTITATIVE: D-Dimer, Quant: 0.73 ug/mL-FEU — ABNORMAL HIGH (ref 0.00–0.50)

## 2022-10-30 MED ORDER — ACETAMINOPHEN 325 MG PO TABS
650.0000 mg | ORAL_TABLET | Freq: Once | ORAL | Status: AC
  Administered 2022-10-30: 650 mg via ORAL
  Filled 2022-10-30: qty 2

## 2022-10-30 MED ORDER — PREDNISONE 20 MG PO TABS
40.0000 mg | ORAL_TABLET | Freq: Once | ORAL | Status: AC
  Administered 2022-10-30: 40 mg via ORAL
  Filled 2022-10-30: qty 2

## 2022-10-30 MED ORDER — LACTATED RINGERS IV BOLUS
1000.0000 mL | Freq: Once | INTRAVENOUS | Status: AC
  Administered 2022-10-30: 1000 mL via INTRAVENOUS

## 2022-10-30 MED ORDER — DOXYCYCLINE HYCLATE 100 MG PO CAPS: 100.0000 mg | ORAL_CAPSULE | Freq: Two times a day (BID) | ORAL | 0 refills | Status: AC

## 2022-10-30 MED ORDER — DOXYCYCLINE HYCLATE 100 MG PO TABS
100.0000 mg | ORAL_TABLET | Freq: Once | ORAL | Status: AC
  Administered 2022-10-30: 100 mg via ORAL
  Filled 2022-10-30: qty 1

## 2022-10-30 MED ORDER — IOHEXOL 350 MG/ML SOLN
100.0000 mL | Freq: Once | INTRAVENOUS | Status: AC | PRN
  Administered 2022-10-30: 75 mL via INTRAVENOUS

## 2022-10-30 MED ORDER — PREDNISONE 10 MG (21) PO TBPK: ORAL_TABLET | Freq: Every day | ORAL | 0 refills | Status: AC

## 2022-10-30 NOTE — ED Triage Notes (Addendum)
Pt states starting on Wednesday, she has not been feeling well. Pt states that she then started having pain in the back of her neck. Pt states last night, she started having a rash on her bilateral arms and legs, as well as joint pain in her arms.  Mild relief with ibuprofen and tylenol  Pt recently increased Tirzepatide dose on Wednesday.

## 2022-10-30 NOTE — Discharge Instructions (Signed)
Please take medications as prescribed Return for reevaluation if you are worse especially if you start running a high fever, inability to tolerate liquids, or other worsening symptoms. Please recheck with your doctor this week to discuss whether or not you are going to continue your medications at the same dose or decrease them or stop. Please follow-up with your doctor to check on labs that were not returned while here in the ED.  These were labs to check for tickborne illnesses.  You have been started on doxycycline to cover those for possible rickettsial infections.

## 2022-10-30 NOTE — ED Provider Notes (Signed)
[facility] Provider Note   CSN: [id number] Arrival date & time: 10/30/22  1191     History  Chief Complaint  Patient presents with   Headache   Generalized Body Aches    Florentine Diekman is a 37 y.o. female.  HPI 37 yo female ho hypertension presents today co generalized weakness and malaise, headache, rash and joint pain.  Symptoms began several days ago and progressed. Rash distal extremities erythematous, joint pain fingers, toes, ankles, wrists, elbows, knees, shoulders, not hips.  Noted autoimmune disorder, no sick contact, no fever or chills.  She has children in school and daycare but they have not been ill.     Home Medications Prior to Admission medications   Medication Sig Start Date End Date Taking? Authorizing Provider  doxycycline (VIBRAMYCIN) 100 MG capsule Take 1 capsule (100 mg total) by mouth 2 (two) times daily. 10/30/22  Yes Margarita Grizzle, MD  predniSONE (STERAPRED UNI-PAK 21 TAB) 10 MG (21) TBPK tablet Take by mouth daily. Take 6 tabs by mouth daily  for 2 days, then 5 tabs for 2 days, then 4 tabs for 2 days, then 3 tabs for 2 days, 2 tabs for 2 days, then 1 tab by mouth daily for 2 days 10/30/22  Yes Margarita Grizzle, MD  acetaminophen (TYLENOL) 500 MG tablet Take 1,000 mg by mouth every 6 (six) hours as needed for mild pain, fever or headache.    [provider]  COVID-19 mRNA vaccine [phone] (COMIRNATY) SUSP injection Inject into the muscle. 02/18/22     influenza vac split quadrivalent PF (FLUARIX) 0.5 ML injection Inject into the muscle. 02/18/22     losartan (COZAAR) 50 MG tablet Take 50 mg by mouth daily.    [provider]  NIFEdipine (PROCARDIA-XL/NIFEDICAL-XL) 30 MG 24 hr tablet Take 2 tablets (60 mg total) by mouth daily. 02/12/20 02/11/21  Dale [place], FNP  Semaglutide-Weight Management (WEGOVY) 0.25 MG/0.5ML SOAJ Inject 0.25 mg into the skin once a week. 02/16/22     sertraline (ZOLOFT) 50 MG  tablet Take 1 tablet (50 mg total) by mouth daily. In one week if no side effect may increase to 75 mg daily. 02/12/20 03/12/22  Dale [place], FNP      Allergies    Bactrim [sulfamethoxazole-trimethoprim]    Review of Systems   Review of Systems  Physical Exam Updated Vital Signs BP (!) 143/98   Pulse 94   Temp 98.2 F (36.8 C) (Oral)   Resp 14   Ht 1.715 m (5' 7.5")   Wt 85.3 kg   LMP 10/30/2022   SpO2 98%   BMI 29.01 kg/m  Physical Exam  ED Results / Procedures / Treatments   Labs (all labs ordered are listed, but only abnormal results are displayed) Labs Reviewed  CBC WITH DIFFERENTIAL/PLATELET - Abnormal; Notable for the following components:      Result Value   Hemoglobin 11.3 (*)    HCT 34.5 (*)    MCV 77.5 (*)    MCH 25.4 (*)    All other components within normal limits  COMPREHENSIVE METABOLIC PANEL - Abnormal; Notable for the following components:   Glucose, Bld 114 (*)    Calcium 8.8 (*)    Alkaline Phosphatase 37 (*)    All other components within normal limits  SEDIMENTATION RATE - Abnormal; Notable for the following components:   Sed Rate 25 (*)    All other components within normal limits  URINALYSIS, ROUTINE W  REFLEX MICROSCOPIC - Abnormal; Notable for the following components:   Hgb urine dipstick LARGE (*)    Ketones, ur 15 (*)    Protein, ur TRACE (*)    Bacteria, UA RARE (*)    All other components within normal limits  D-DIMER, QUANTITATIVE - Abnormal; Notable for the following components:   D-Dimer, Quant 0.73 (*)    All other components within normal limits  CULTURE, BLOOD (ROUTINE X 2)  CULTURE, BLOOD (ROUTINE X 2)  LACTIC ACID, PLASMA  PREGNANCY, URINE  ROCKY MTN SPOTTED FVR ABS PNL(IGG+IGM)  LYME DISEASE SEROLOGY W/REFLEX  C-REACTIVE PROTEIN  LACTIC ACID, PLASMA    EKG EKG Interpretation  Date/Time:  Saturday October 30 2022 08:28:50 EDT Ventricular Rate:  109 PR Interval:  135 QRS Duration: 80 QT Interval:  333 QTC  Calculation: 449 R Axis:   68 Text Interpretation: Sinus tachycardia Borderline T abnormalities, diffuse leads Confirmed by Margarita Grizzle [phone]) on 10/30/2022 8:43:30 AM  Radiology CT Angio Chest PE W and/or Wo Contrast  Result Date: 10/30/2022 CLINICAL DATA:  Flank pain and abdominal pain. Kidney stones suspected. Pulmonary embolism suspected. EXAM: CT ANGIOGRAPHY CHEST CT ABDOMEN AND PELVIS WITH CONTRAST TECHNIQUE: Multidetector CT imaging of the chest was performed using the standard protocol during bolus administration of intravenous contrast. Multiplanar CT image reconstructions and MIPs were obtained to evaluate the vascular anatomy. Multidetector CT imaging of the abdomen and pelvis was performed using the standard protocol during bolus administration of intravenous contrast. RADIATION DOSE REDUCTION: This exam was performed according to the departmental dose-optimization program which includes automated exposure control, adjustment of the mA and/or kV according to patient size and/or use of iterative reconstruction technique. CONTRAST:  75mL OMNIPAQUE IOHEXOL 350 MG/ML SOLN COMPARISON:  None Available. FINDINGS: CTA CHEST FINDINGS Cardiovascular: Satisfactory opacification of the pulmonary arteries to the segmental level. No evidence of pulmonary embolism. Normal heart size. No pericardial effusion. Mediastinum/Nodes: No enlarged mediastinal, hilar, or axillary lymph nodes. Thyroid gland, trachea, and esophagus demonstrate no significant findings. Lungs/Pleura: No pleural fluid, interstitial edema, airspace consolidation, or pneumothorax. Musculoskeletal: No chest wall abnormality. No acute or significant osseous findings. Review of the MIP images confirms the above findings. CT ABDOMEN and PELVIS FINDINGS Hepatobiliary: No focal liver abnormality is seen. No gallstones, gallbladder wall thickening, or biliary dilatation. Pancreas: Unremarkable. No pancreatic ductal dilatation or surrounding  inflammatory changes. Spleen: Normal in size without focal abnormality. Adrenals/Urinary Tract: Adrenal glands are unremarkable. Kidneys are normal, without renal calculi, focal lesion, or hydronephrosis. Bladder is unremarkable. Stomach/Bowel: Small hiatal hernia. Appendix appears normal. No evidence of bowel wall thickening, distention, or inflammatory changes. Vascular/Lymphatic: Normal appearance of the abdominal aorta. Aortic atherosclerosis. No enlarged abdominopelvic adenopathy. Reproductive: Uterus appears retroflexed with multiple fibroids. No adnexal mass. Other: No free fluid or fluid collections. Musculoskeletal: No acute or significant osseous findings. Review of the MIP images confirms the above findings. IMPRESSION: 1. No evidence for acute pulmonary embolus. 2. No acute findings within the abdomen or pelvis. 3. Small hiatal hernia. 4. Uterine fibroids. 5.  Aortic Atherosclerosis (ICD10-I70.0). Electronically Signed   By: Signa Kell M.D.   On: 10/30/2022 12:37   CT ABDOMEN PELVIS W CONTRAST  Result Date: 10/30/2022 CLINICAL DATA:  Flank pain and abdominal pain. Kidney stones suspected. Pulmonary embolism suspected. EXAM: CT ANGIOGRAPHY CHEST CT ABDOMEN AND PELVIS WITH CONTRAST TECHNIQUE: Multidetector CT imaging of the chest was performed using the standard protocol during bolus administration of intravenous contrast. Multiplanar CT image reconstructions and MIPs were  obtained to evaluate the vascular anatomy. Multidetector CT imaging of the abdomen and pelvis was performed using the standard protocol during bolus administration of intravenous contrast. RADIATION DOSE REDUCTION: This exam was performed according to the departmental dose-optimization program which includes automated exposure control, adjustment of the mA and/or kV according to patient size and/or use of iterative reconstruction technique. CONTRAST:  75mL OMNIPAQUE IOHEXOL 350 MG/ML SOLN COMPARISON:  None Available. FINDINGS:  CTA CHEST FINDINGS Cardiovascular: Satisfactory opacification of the pulmonary arteries to the segmental level. No evidence of pulmonary embolism. Normal heart size. No pericardial effusion. Mediastinum/Nodes: No enlarged mediastinal, hilar, or axillary lymph nodes. Thyroid gland, trachea, and esophagus demonstrate no significant findings. Lungs/Pleura: No pleural fluid, interstitial edema, airspace consolidation, or pneumothorax. Musculoskeletal: No chest wall abnormality. No acute or significant osseous findings. Review of the MIP images confirms the above findings. CT ABDOMEN and PELVIS FINDINGS Hepatobiliary: No focal liver abnormality is seen. No gallstones, gallbladder wall thickening, or biliary dilatation. Pancreas: Unremarkable. No pancreatic ductal dilatation or surrounding inflammatory changes. Spleen: Normal in size without focal abnormality. Adrenals/Urinary Tract: Adrenal glands are unremarkable. Kidneys are normal, without renal calculi, focal lesion, or hydronephrosis. Bladder is unremarkable. Stomach/Bowel: Small hiatal hernia. Appendix appears normal. No evidence of bowel wall thickening, distention, or inflammatory changes. Vascular/Lymphatic: Normal appearance of the abdominal aorta. Aortic atherosclerosis. No enlarged abdominopelvic adenopathy. Reproductive: Uterus appears retroflexed with multiple fibroids. No adnexal mass. Other: No free fluid or fluid collections. Musculoskeletal: No acute or significant osseous findings. Review of the MIP images confirms the above findings. IMPRESSION: 1. No evidence for acute pulmonary embolus. 2. No acute findings within the abdomen or pelvis. 3. Small hiatal hernia. 4. Uterine fibroids. 5.  Aortic Atherosclerosis (ICD10-I70.0). Electronically Signed   By: Signa Kell M.D.   On: 10/30/2022 12:37   DG Chest Port 1 View  Result Date: 10/30/2022 CLINICAL DATA:  Weakness. EXAM: PORTABLE CHEST 1 VIEW COMPARISON:  None Available. FINDINGS: The heart size  and mediastinal contours are within normal limits. Both lungs are clear. The visualized skeletal structures are unremarkable. IMPRESSION: No active disease. Electronically Signed   By: Lupita Raider M.D.   On: 10/30/2022 09:05   CT Head Wo Contrast  Result Date: 10/30/2022 CLINICAL DATA:  Headache, sudden and severe. EXAM: CT HEAD WITHOUT CONTRAST TECHNIQUE: Contiguous axial images were obtained from the base of the skull through the vertex without intravenous contrast. RADIATION DOSE REDUCTION: This exam was performed according to the departmental dose-optimization program which includes automated exposure control, adjustment of the mA and/or kV according to patient size and/or use of iterative reconstruction technique. COMPARISON:  None Available. FINDINGS: Brain: No evidence of acute infarction, hemorrhage, hydrocephalus, extra-axial collection or mass lesion/mass effect. Vascular: No hyperdense vessel or unexpected calcification. Skull: Normal. Negative for fracture or focal lesion. Sinuses/Orbits: Opacification of the minimally covered left maxillary sinus. IMPRESSION: 1. Normal appearance of the brain. 2. Opacification of the minimally covered left maxillary sinus. Electronically Signed   By: Tiburcio Pea M.D.   On: 10/30/2022 08:46    Procedures Procedures    Medications Ordered in ED Medications  predniSONE (DELTASONE) tablet 40 mg (has no administration in time range)  doxycycline (VIBRA-TABS) tablet 100 mg (has no administration in time range)  lactated ringers bolus 1,000 mL (0 mLs Intravenous Stopped 10/30/22 0953)  acetaminophen (TYLENOL) tablet 650 mg (650 mg Oral Given 10/30/22 1141)  iohexol (OMNIPAQUE) 350 MG/ML injection 100 mL (75 mLs Intravenous Contrast Given 10/30/22 1145)  ED Course/ Medical Decision Making/ A&P Clinical Course as of 10/30/22 1336  Sat Oct 30, 2022  0918 D-dimer reviewed interpreted and slightly elevated at 0.73 CBC is reviewed interpreted with  normal white blood cell count of 5300 and mild anemia with hemoglobin of 11.3, hemoglobin stable from first prior [DR]  1030 Cxr reviewed and interpreted and normal [DR]    Clinical Course User Index [DR] Margarita Grizzle, MD                             Medical Decision Making Amount and/or Complexity of Data Reviewed Labs: ordered. Radiology: ordered.  Risk OTC drugs. Prescription drug management.   Patient taking tirzepatide from compounding pharmacy [facility] in [place], pharmacor Patient took increased dose of above DDX includes but not limited to bacterial, viral infection, autoimmune, medication reaction- medication is compounded at pharmacy in [facility]  Patient with normal cbc and cmet Rickettsial etiology considered and lyme and rmsf labs sent. Patient continues to have myalgias and feel poorly Vital signs remain stable Will rx with doxycycline Discussed with patient and pharmacist that she is taking compounded medication and unclear reporting mechanism- pharmacist will assist with researching and reporting if needed. Plan Short course of prednisone We have discussed return precautions including worsening symptoms, fever, redness, bleeding to tolerate liquids and need for close follow-up with her primary care physician.  She will recheck with her primary care for further results and to discuss whether or not to continue on the tirzepatide.  We have discussed that it may be a reaction to the tirzepatide at her increased dose itself.  If she chooses to continue tirzepatide she will cut back previous dose this week       Final Clinical Impression(s) / ED Diagnoses Final diagnoses:  Arthralgia, unspecified joint  Rash    Rx / DC Orders ED Discharge Orders          Ordered    predniSONE (STERAPRED UNI-PAK 21 TAB) 10 MG (21) TBPK tablet  Daily        10/30/22 1335    doxycycline (VIBRAMYCIN) 100 MG capsule  2 times daily        10/30/22 1335               Margarita Grizzle, MD 10/30/22 1336

## 2022-10-30 NOTE — ED Notes (Signed)
Pt o CT

## 2022-10-30 NOTE — ED Notes (Signed)
Patient reports worsening of joint pain and spreading of rash on her legs.

## 2022-11-01 LAB — LYME DISEASE SEROLOGY W/REFLEX: Lyme Total Antibody EIA: NEGATIVE

## 2022-11-01 LAB — CULTURE, BLOOD (ROUTINE X 2): Culture: NO GROWTH

## 2022-11-02 LAB — CULTURE, BLOOD (ROUTINE X 2): Special Requests: ADEQUATE

## 2022-11-03 LAB — CULTURE, BLOOD (ROUTINE X 2)

## 2022-11-04 LAB — CULTURE, BLOOD (ROUTINE X 2): Culture: NO GROWTH

## 2024-02-22 DIAGNOSIS — F418 Other specified anxiety disorders: Secondary | ICD-10-CM | POA: Diagnosis not present

## 2024-02-22 DIAGNOSIS — F17200 Nicotine dependence, unspecified, uncomplicated: Secondary | ICD-10-CM | POA: Diagnosis not present

## 2024-02-22 DIAGNOSIS — G2581 Restless legs syndrome: Secondary | ICD-10-CM | POA: Diagnosis not present

## 2024-02-22 DIAGNOSIS — G4700 Insomnia, unspecified: Secondary | ICD-10-CM | POA: Diagnosis not present

## 2024-03-07 DIAGNOSIS — F418 Other specified anxiety disorders: Secondary | ICD-10-CM | POA: Diagnosis not present

## 2024-03-07 DIAGNOSIS — G2581 Restless legs syndrome: Secondary | ICD-10-CM | POA: Diagnosis not present

## 2024-03-07 DIAGNOSIS — G4700 Insomnia, unspecified: Secondary | ICD-10-CM | POA: Diagnosis not present

## 2024-03-07 DIAGNOSIS — F17200 Nicotine dependence, unspecified, uncomplicated: Secondary | ICD-10-CM | POA: Diagnosis not present
